# Patient Record
Sex: Male | Born: 1955 | Race: White | Hispanic: No | Marital: Married | Smoking: Never smoker
Health system: Southern US, Community
[De-identification: ages and names within clinical notes are randomized; demographics above are authoritative.]

## PROBLEM LIST (undated history)

## (undated) DIAGNOSIS — N529 Male erectile dysfunction, unspecified: Secondary | ICD-10-CM

## (undated) DIAGNOSIS — N200 Calculus of kidney: Secondary | ICD-10-CM

## (undated) DIAGNOSIS — I4891 Unspecified atrial fibrillation: Secondary | ICD-10-CM

## (undated) DIAGNOSIS — Z85038 Personal history of other malignant neoplasm of large intestine: Secondary | ICD-10-CM

## (undated) DIAGNOSIS — C187 Malignant neoplasm of sigmoid colon: Secondary | ICD-10-CM

## (undated) DIAGNOSIS — R7303 Prediabetes: Secondary | ICD-10-CM

## (undated) DIAGNOSIS — T451X5A Adverse effect of antineoplastic and immunosuppressive drugs, initial encounter: Secondary | ICD-10-CM

## (undated) DIAGNOSIS — K219 Gastro-esophageal reflux disease without esophagitis: Secondary | ICD-10-CM

## (undated) DIAGNOSIS — G620 Drug-induced polyneuropathy: Secondary | ICD-10-CM

## (undated) DIAGNOSIS — I10 Essential (primary) hypertension: Secondary | ICD-10-CM

## (undated) DIAGNOSIS — G4730 Sleep apnea, unspecified: Secondary | ICD-10-CM

## (undated) HISTORY — PX: FRACTURE SURGERY: SHX138

## (undated) HISTORY — PX: LITHOTRIPSY: SUR834

## (undated) HISTORY — DX: Calculus of kidney: N20.0

## (undated) HISTORY — DX: Gastro-esophageal reflux disease without esophagitis: K21.9

## (undated) HISTORY — DX: Sleep apnea, unspecified: G47.30

## (undated) HISTORY — DX: Unspecified atrial fibrillation: I48.91

## (undated) HISTORY — DX: Malignant neoplasm of sigmoid colon: C18.7

---

## 1999-03-01 HISTORY — PX: WRIST SURGERY: SHX841

## 2005-09-24 ENCOUNTER — Ambulatory Visit: Payer: Self-pay

## 2005-10-08 ENCOUNTER — Ambulatory Visit: Payer: Self-pay

## 2008-04-11 ENCOUNTER — Emergency Department: Payer: Self-pay | Admitting: Emergency Medicine

## 2008-04-16 ENCOUNTER — Ambulatory Visit: Payer: Self-pay | Admitting: Specialist

## 2008-04-17 ENCOUNTER — Ambulatory Visit: Payer: Self-pay | Admitting: Specialist

## 2013-02-28 DIAGNOSIS — C187 Malignant neoplasm of sigmoid colon: Secondary | ICD-10-CM

## 2013-02-28 HISTORY — DX: Malignant neoplasm of sigmoid colon: C18.7

## 2013-06-10 ENCOUNTER — Ambulatory Visit: Payer: Self-pay | Admitting: Unknown Physician Specialty

## 2013-06-10 HISTORY — PX: OTHER SURGICAL HISTORY: SHX169

## 2013-06-10 LAB — HEPATIC FUNCTION PANEL A (ARMC)
ALK PHOS: 70 U/L
ALT: 21 U/L (ref 12–78)
Albumin: 4 g/dL (ref 3.4–5.0)
Bilirubin, Direct: 0.1 mg/dL (ref 0.00–0.20)
Bilirubin,Total: 0.4 mg/dL (ref 0.2–1.0)
SGOT(AST): 21 U/L (ref 15–37)
TOTAL PROTEIN: 7.7 g/dL (ref 6.4–8.2)

## 2013-06-10 LAB — CBC WITH DIFFERENTIAL/PLATELET
Basophil #: 0 10*3/uL (ref 0.0–0.1)
Basophil %: 0.6 %
Eosinophil #: 0 10*3/uL (ref 0.0–0.7)
Eosinophil %: 0.8 %
HCT: 46.3 % (ref 40.0–52.0)
HGB: 15 g/dL (ref 13.0–18.0)
Lymphocyte #: 1.1 10*3/uL (ref 1.0–3.6)
Lymphocyte %: 23.6 %
MCH: 27.8 pg (ref 26.0–34.0)
MCHC: 32.5 g/dL (ref 32.0–36.0)
MCV: 86 fL (ref 80–100)
MONO ABS: 0.5 x10 3/mm (ref 0.2–1.0)
MONOS PCT: 11.5 %
NEUTROS ABS: 2.9 10*3/uL (ref 1.4–6.5)
Neutrophil %: 63.5 %
Platelet: 134 10*3/uL — ABNORMAL LOW (ref 150–440)
RBC: 5.4 10*6/uL (ref 4.40–5.90)
RDW: 13.7 % (ref 11.5–14.5)
WBC: 4.6 10*3/uL (ref 3.8–10.6)

## 2013-06-10 LAB — PROTIME-INR
INR: 0.9
Prothrombin Time: 12.3 secs (ref 11.5–14.7)

## 2013-06-10 LAB — BASIC METABOLIC PANEL
Anion Gap: 5 — ABNORMAL LOW (ref 7–16)
BUN: 17 mg/dL (ref 7–18)
CHLORIDE: 102 mmol/L (ref 98–107)
CO2: 30 mmol/L (ref 21–32)
CREATININE: 0.86 mg/dL (ref 0.60–1.30)
Calcium, Total: 8.6 mg/dL (ref 8.5–10.1)
EGFR (African American): >60
GLUCOSE: 102 mg/dL — AB (ref 65–99)
OSMOLALITY: 276 (ref 275–301)
Potassium: 4 mmol/L (ref 3.5–5.1)
Sodium: 137 mmol/L (ref 136–145)

## 2013-06-11 ENCOUNTER — Encounter: Payer: Self-pay | Admitting: General Surgery

## 2013-06-11 ENCOUNTER — Ambulatory Visit: Payer: Self-pay | Admitting: Unknown Physician Specialty

## 2013-06-11 ENCOUNTER — Ambulatory Visit (INDEPENDENT_AMBULATORY_CARE_PROVIDER_SITE_OTHER): Payer: BC Managed Care – PPO | Admitting: General Surgery

## 2013-06-11 VITALS — BP 134/72 | HR 72 | Resp 12 | Ht 66.0 in | Wt 231.0 lb

## 2013-06-11 DIAGNOSIS — C187 Malignant neoplasm of sigmoid colon: Secondary | ICD-10-CM

## 2013-06-11 DIAGNOSIS — Z86010 Personal history of colonic polyps: Secondary | ICD-10-CM

## 2013-06-11 DIAGNOSIS — K6389 Other specified diseases of intestine: Secondary | ICD-10-CM

## 2013-06-11 LAB — CEA: CEA: 3 ng/mL (ref 0.0–4.7)

## 2013-06-11 LAB — PATHOLOGY REPORT

## 2013-06-11 MED ORDER — NEOMYCIN SULFATE 500 MG PO TABS: 500.0000 mg | ORAL_TABLET | Freq: Once | ORAL | Status: DC

## 2013-06-11 MED ORDER — METRONIDAZOLE 500 MG PO TABS: 500.0000 mg | ORAL_TABLET | Freq: Once | ORAL | Status: DC

## 2013-06-11 MED ORDER — POLYETHYLENE GLYCOL 3350 17 GM/SCOOP PO POWD: 1.0000 | Freq: Once | ORAL | Status: DC
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
## 2013-06-11 NOTE — Patient Instructions (Addendum)
Patient to be scheduled for colon surgery. The patient is aware to call back for any questions or concerns.  Patient is scheduled for surgery at [facility] for 06/21/13. He will pre admit by phone for this. Patient is aware of bowel prep instructions. Miralax prescription and Flagyl and Neomycin prescriptions sent to his pharmacy.

## 2013-06-12 ENCOUNTER — Other Ambulatory Visit: Payer: Self-pay | Admitting: General Surgery

## 2013-06-12 DIAGNOSIS — C187 Malignant neoplasm of sigmoid colon: Secondary | ICD-10-CM | POA: Insufficient documentation

## 2013-06-12 DIAGNOSIS — Z860100 Personal history of colon polyps, unspecified: Secondary | ICD-10-CM | POA: Insufficient documentation

## 2013-06-13 ENCOUNTER — Telehealth: Payer: Self-pay

## 2013-06-13 NOTE — Telephone Encounter (Signed)
Patient has been scheduled for an EKG at [facility] for 06/14/13 at 8:30 am. He will arrive by 8:00 am and report to the Registration desk in the [place]. Patient is aware of date and time.

## 2013-06-14 ENCOUNTER — Ambulatory Visit: Payer: Self-pay | Admitting: General Surgery

## 2013-06-19 ENCOUNTER — Telehealth: Payer: Self-pay

## 2013-06-19 NOTE — Telephone Encounter (Signed)
Pre admit testing called and states that the patient has A-Fib on his recent EKG done 06/14/13. He will require cardiac clearance for his surgery scheduled 06/21/13. The patient is scheduled to see Dr Miquel Dunn today 06/19/13 at 3 pm for this. Patient is aware of date and time.

## 2013-06-20 HISTORY — PX: SIGMOIDOSCOPY: SUR1295

## 2013-06-20 HISTORY — PX: COLON SURGERY: SHX602

## 2013-06-20 NOTE — Patient Instructions (Signed)
Cardiology clearance received from Dr Sharlet Salina  office at [facility]. Cardiac clearance faxed to [facility] Pre admitting testing.

## 2013-06-21 ENCOUNTER — Inpatient Hospital Stay: Payer: Self-pay | Admitting: General Surgery

## 2013-06-21 DIAGNOSIS — C187 Malignant neoplasm of sigmoid colon: Secondary | ICD-10-CM

## 2013-06-22 LAB — CREATININE, SERUM
Creatinine: 0.93 mg/dL (ref 0.60–1.30)
EGFR (African American): >60
EGFR (Non-African Amer.): >60

## 2013-06-23 LAB — BASIC METABOLIC PANEL
ANION GAP: 6 — AB (ref 7–16)
BUN: 13 mg/dL (ref 7–18)
CALCIUM: 8.3 mg/dL — AB (ref 8.5–10.1)
CO2: 29 mmol/L (ref 21–32)
CREATININE: 0.89 mg/dL (ref 0.60–1.30)
Chloride: 105 mmol/L (ref 98–107)
EGFR (African American): >60
EGFR (Non-African Amer.): >60
Glucose: 116 mg/dL — ABNORMAL HIGH (ref 65–99)
Osmolality: 280 (ref 275–301)
Potassium: 3.4 mmol/L — ABNORMAL LOW (ref 3.5–5.1)
SODIUM: 140 mmol/L (ref 136–145)

## 2013-06-23 LAB — CBC WITH DIFFERENTIAL/PLATELET
BASOS ABS: 0.1 10*3/uL (ref 0.0–0.1)
Basophil %: 0.7 %
EOS ABS: 0.1 10*3/uL (ref 0.0–0.7)
Eosinophil %: 0.9 %
HCT: 40 % (ref 40.0–52.0)
HGB: 13.4 g/dL (ref 13.0–18.0)
LYMPHS ABS: 1.6 10*3/uL (ref 1.0–3.6)
Lymphocyte %: 19.8 %
MCH: 29 pg (ref 26.0–34.0)
MCHC: 33.4 g/dL (ref 32.0–36.0)
MCV: 87 fL (ref 80–100)
MONOS PCT: 7.6 %
Monocyte #: 0.6 x10 3/mm (ref 0.2–1.0)
NEUTROS PCT: 71 %
Neutrophil #: 5.9 10*3/uL (ref 1.4–6.5)
Platelet: 232 10*3/uL (ref 150–440)
RBC: 4.61 10*6/uL (ref 4.40–5.90)
RDW: 14.3 % (ref 11.5–14.5)
WBC: 8.2 10*3/uL (ref 3.8–10.6)

## 2013-06-24 ENCOUNTER — Encounter: Payer: Self-pay | Admitting: General Surgery

## 2013-06-24 ENCOUNTER — Telehealth: Payer: Self-pay | Admitting: General Surgery

## 2013-06-24 LAB — PATHOLOGY REPORT

## 2013-06-24 NOTE — Telephone Encounter (Signed)
Notified of pathology results. 1 of 13 nodes positive.  Will benefit from Medical oncology input. Will move f/u appt from 5/13 to 5/7.

## 2013-06-25 ENCOUNTER — Telehealth: Payer: Self-pay | Admitting: *Deleted

## 2013-06-25 ENCOUNTER — Encounter: Payer: Self-pay | Admitting: General Surgery

## 2013-06-25 NOTE — Telephone Encounter (Signed)
Message copied by Chrystie Nose on Tue Jun 25, 2013  8:48 AM ------      Message from: Caney, Forest Gleason      Created: Tue Jun 25, 2013  8:09 AM       Contact patient and arrange for him to come in on Friday morning for staple removal. Apply steristrips. Thanks.  ------

## 2013-06-25 NOTE — Telephone Encounter (Signed)
Notified patient as instructed, patient pleased. Discussed follow-up appointments, patient agrees appt 06/28/13 @ 9.00am

## 2013-06-28 ENCOUNTER — Ambulatory Visit (INDEPENDENT_AMBULATORY_CARE_PROVIDER_SITE_OTHER): Payer: BC Managed Care – PPO | Admitting: *Deleted

## 2013-06-28 DIAGNOSIS — C187 Malignant neoplasm of sigmoid colon: Secondary | ICD-10-CM

## 2013-06-28 NOTE — Patient Instructions (Signed)
The staples were removed and steri strips applied. 

## 2013-07-03 ENCOUNTER — Encounter: Payer: Self-pay | Admitting: General Surgery

## 2013-07-04 ENCOUNTER — Encounter: Payer: Self-pay | Admitting: General Surgery

## 2013-07-04 ENCOUNTER — Ambulatory Visit (INDEPENDENT_AMBULATORY_CARE_PROVIDER_SITE_OTHER): Payer: BC Managed Care – PPO | Admitting: General Surgery

## 2013-07-04 VITALS — BP 130/82 | HR 74 | Resp 14 | Ht 66.0 in | Wt 228.0 lb

## 2013-07-04 DIAGNOSIS — C187 Malignant neoplasm of sigmoid colon: Secondary | ICD-10-CM

## 2013-07-04 NOTE — Progress Notes (Signed)
Patient ID: Paul Irwin, male   DOB: 1955/07/18, 58 y.o.   MRN: [id number]  Chief Complaint  Patient presents with  . Follow-up    Post Op Sigmoid Colectomy    HPI Paul Irwin is a 58 y.o. male here today for his post op sigmoid colectomy done on 06/20/13. Patient states he is doing well.  HPI  Past Medical History  Diagnosis Date  . Sleep apnea   . Malignant neoplasm of sigmoid colon     T3,N1a. Moderately well differentiated.  1 13 nodes positive.     Past Surgical History  Procedure Laterality Date  . Colonoscopy  06/10/13    Dr. Vira Agar  . Wrist surgery Right 2001  . Sigmoidoscopy  06/20/13    Family History  Problem Relation Age of Onset  . Heart disease Mother   . Heart disease Father     Social History History  Substance Use Topics  . Smoking status: Never Smoker   . Smokeless tobacco: Never Used  . Alcohol Use: No    No Known Allergies  Current Outpatient Prescriptions  Medication Sig Dispense Refill  . HYDROcodone-acetaminophen (NORCO/VICODIN) 5-325 MG per tablet Take 1 tablet by mouth as needed.      . metoprolol succinate (TOPROL-XL) 25 MG 24 hr tablet Take 1 tablet by mouth daily.      . metroNIDAZOLE (FLAGYL) 500 MG tablet Take 1 tablet (500 mg total) by mouth once. Take 1 tablet at 6 PM, then take remaninig 1 tablet at 11 PM.  2 tablet  0  . neomycin (MYCIFRADIN) 500 MG tablet Take 1 tablet (500 mg total) by mouth once. Take 2 tablets at 6 pm, then take remaninig 2 tablets at 11 pm.  4 tablet  0  . polyethylene glycol powder (GLYCOLAX/MIRALAX) powder Take 255 g (1 Container total) by mouth once.  255 g  0   No current facility-administered medications for this visit.    Review of Systems Review of Systems  Constitutional: Negative.   Respiratory: Negative.   Cardiovascular: Negative.     Blood pressure 130/82, pulse 74, resp. rate 14, height 5\' 6"  (1.676 m), weight 228 lb (103.42 kg).  Physical Exam Physical Exam  Constitutional: He is  oriented to person, place, and time. He appears well-developed and well-nourished.  Eyes: Conjunctivae are normal.  Neck: Thyromegaly present.  Cardiovascular: Normal rate, regular rhythm and normal heart sounds.   Pulmonary/Chest: Effort normal and breath sounds normal.  Abdominal: Soft. Normal appearance and bowel sounds are normal. There is no tenderness.  Incision looks clean and healing well. 5 by 15 granulation tissue at the lower area.   Neurological: He is alert and oriented to person, place, and time.  Skin: Skin is warm and dry.    Data Reviewed Pathology reviewed.  Assessment    Doing well status post sigmoid resection. Candidate for adjuvant chemotherapy     Plan    Role of adjuvant chemotherapy was discussed with the patient and his wife. Arrangements are in place for evaluation by medical oncology early next week.  A port will likely be required, but discussion of this will be deferred until after medical oncology assessment.    PCP: Paul Irwin  Patient is scheduled to see Oncology - Dr Paul Irwin at [facility] on 07/10/13 at 9:15 am. Patient is aware of date and time.   Paul Irwin 07/07/2013, 9:24 AM

## 2013-07-04 NOTE — Patient Instructions (Addendum)
Proper lifting techniques reviewed.Patient see the Medical Oncology . Patient to return in one month.    Patient is scheduled to see Oncology - Dr Grayland Ormond at [facility] on 07/10/13 at 9:15 am. Patient is aware of date and time.

## 2013-07-05 LAB — CEA: CEA: 0.5 ng/mL (ref 0.0–4.7)

## 2013-07-07 ENCOUNTER — Encounter: Payer: Self-pay | Admitting: General Surgery

## 2013-07-10 ENCOUNTER — Telehealth: Payer: Self-pay | Admitting: *Deleted

## 2013-07-10 ENCOUNTER — Ambulatory Visit: Payer: Self-pay | Admitting: Oncology

## 2013-07-10 ENCOUNTER — Ambulatory Visit: Payer: Self-pay | Admitting: General Surgery

## 2013-07-10 NOTE — Progress Notes (Signed)
Patient ID: Paul Irwin, male   DOB: 1955/11/07, 58 y.o.   MRN: [id number] Spoke with patient about having his Port Placement surgery done. Patient is scheduled for surgery at [facility] on 07/12/13. He will pre admit by phone on 07/11/13. Patient is aware of date and all instructions.

## 2013-07-15 ENCOUNTER — Ambulatory Visit: Payer: Self-pay | Admitting: Vascular Surgery

## 2013-07-24 LAB — COMPREHENSIVE METABOLIC PANEL
AST: 13 U/L — AB (ref 15–37)
Albumin: 3.6 g/dL (ref 3.4–5.0)
Alkaline Phosphatase: 59 U/L
Anion Gap: 8 (ref 7–16)
BILIRUBIN TOTAL: 0.4 mg/dL (ref 0.2–1.0)
BUN: 22 mg/dL — ABNORMAL HIGH (ref 7–18)
CO2: 29 mmol/L (ref 21–32)
CREATININE: 0.91 mg/dL (ref 0.60–1.30)
Calcium, Total: 8.7 mg/dL (ref 8.5–10.1)
Chloride: 103 mmol/L (ref 98–107)
EGFR (African American): >60
EGFR (Non-African Amer.): >60
Glucose: 139 mg/dL — ABNORMAL HIGH (ref 65–99)
Osmolality: 285 (ref 275–301)
Potassium: 3.7 mmol/L (ref 3.5–5.1)
SGPT (ALT): 23 U/L (ref 12–78)
Sodium: 140 mmol/L (ref 136–145)
Total Protein: 6.7 g/dL (ref 6.4–8.2)

## 2013-07-24 LAB — CBC CANCER CENTER
BASOS PCT: 1 %
Basophil #: 0.1 x10 3/mm (ref 0.0–0.1)
Eosinophil #: 0.1 x10 3/mm (ref 0.0–0.7)
Eosinophil %: 1.5 %
HCT: 40.6 % (ref 40.0–52.0)
HGB: 13.6 g/dL (ref 13.0–18.0)
Lymphocyte #: 1.5 x10 3/mm (ref 1.0–3.6)
Lymphocyte %: 25.8 %
MCH: 28.6 pg (ref 26.0–34.0)
MCHC: 33.5 g/dL (ref 32.0–36.0)
MCV: 85 fL (ref 80–100)
Monocyte #: 0.4 x10 3/mm (ref 0.2–1.0)
Monocyte %: 6.6 %
NEUTROS ABS: 3.8 x10 3/mm (ref 1.4–6.5)
NEUTROS PCT: 65.1 %
PLATELETS: 154 x10 3/mm (ref 150–440)
RBC: 4.76 10*6/uL (ref 4.40–5.90)
RDW: 14.4 % (ref 11.5–14.5)
WBC: 5.8 x10 3/mm (ref 3.8–10.6)

## 2013-07-25 LAB — CEA: CEA: 0.5 ng/mL (ref 0.0–4.7)

## 2013-07-29 ENCOUNTER — Ambulatory Visit: Payer: Self-pay | Admitting: Oncology

## 2013-07-31 LAB — CBC CANCER CENTER
BASOS ABS: 0 x10 3/mm (ref 0.0–0.1)
BASOS PCT: 0.3 %
Eosinophil #: 0.3 x10 3/mm (ref 0.0–0.7)
Eosinophil %: 4.2 %
HCT: 41.7 % (ref 40.0–52.0)
HGB: 13.7 g/dL (ref 13.0–18.0)
Lymphocyte #: 2 x10 3/mm (ref 1.0–3.6)
Lymphocyte %: 29.6 %
MCH: 28.2 pg (ref 26.0–34.0)
MCHC: 32.8 g/dL (ref 32.0–36.0)
MCV: 86 fL (ref 80–100)
MONO ABS: 0.4 x10 3/mm (ref 0.2–1.0)
Monocyte %: 5.7 %
Neutrophil #: 4 x10 3/mm (ref 1.4–6.5)
Neutrophil %: 60.2 %
Platelet: 150 x10 3/mm (ref 150–440)
RBC: 4.86 10*6/uL (ref 4.40–5.90)
RDW: 14.3 % (ref 11.5–14.5)
WBC: 6.6 x10 3/mm (ref 3.8–10.6)

## 2013-07-31 LAB — BASIC METABOLIC PANEL
Anion Gap: 7 (ref 7–16)
BUN: 25 mg/dL — ABNORMAL HIGH (ref 7–18)
CALCIUM: 8.8 mg/dL (ref 8.5–10.1)
Chloride: 103 mmol/L (ref 98–107)
Co2: 30 mmol/L (ref 21–32)
Creatinine: 0.91 mg/dL (ref 0.60–1.30)
EGFR (African American): >60
Glucose: 97 mg/dL (ref 65–99)
Osmolality: 284 (ref 275–301)
Potassium: 4.2 mmol/L (ref 3.5–5.1)
Sodium: 140 mmol/L (ref 136–145)

## 2013-08-05 ENCOUNTER — Encounter: Payer: Self-pay | Admitting: General Surgery

## 2013-08-05 ENCOUNTER — Ambulatory Visit (INDEPENDENT_AMBULATORY_CARE_PROVIDER_SITE_OTHER): Payer: BC Managed Care – PPO | Admitting: General Surgery

## 2013-08-05 VITALS — BP 130/74 | HR 72 | Resp 14 | Ht 66.0 in | Wt 231.0 lb

## 2013-08-05 DIAGNOSIS — C187 Malignant neoplasm of sigmoid colon: Secondary | ICD-10-CM

## 2013-08-05 NOTE — Patient Instructions (Signed)
Patient to return in six months  

## 2013-08-05 NOTE — Progress Notes (Signed)
Patient ID: Paul Irwin, male   DOB: [birth year], 58 y.o.   MRN: [id number]  Chief Complaint  Patient presents with  . Follow-up    Colon cancer    HPI Paul Irwin is a 58 y.o. male here today for his follow up sigmoid colectomy done on 06/20/13. Patient states he is doing well. He started his chemo on 07/24/13. He tolerated this well. 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  HPI  Past Medical History  Diagnosis Date  . Sleep apnea   . Malignant neoplasm of sigmoid colon     T3,N1a. Moderately well differentiated.  1 13 nodes positive.     Past Surgical History  Procedure Laterality Date  . Colonoscopy  06/10/13    Dr. Vira Agar  . Wrist surgery Right 2001  . Sigmoidoscopy  06/20/13    Family History  Problem Relation Age of Onset  . Heart disease Mother   . Heart disease Father     Social History History  Substance Use Topics  . Smoking status: Never Smoker   . Smokeless tobacco: Never Used  . Alcohol Use: No    No Known Allergies  Current Outpatient Prescriptions  Medication Sig Dispense Refill  . HYDROcodone-acetaminophen (NORCO/VICODIN) 5-325 MG per tablet Take 1 tablet by mouth as needed.      . metoprolol succinate (TOPROL-XL) 25 MG 24 hr tablet Take 1 tablet by mouth daily.      . metroNIDAZOLE (FLAGYL) 500 MG tablet Take 1 tablet (500 mg total) by mouth once. Take 1 tablet at 6 PM, then take remaninig 1 tablet at 11 PM.  2 tablet  0  . neomycin (MYCIFRADIN) 500 MG tablet Take 1 tablet (500 mg total) by mouth once. Take 2 tablets at 6 pm, then take remaninig 2 tablets at 11 pm.  4 tablet  0  . polyethylene glycol powder (GLYCOLAX/MIRALAX) powder Take 255 g (1 Container total) by mouth once.  255 g  0  . prochlorperazine (COMPAZINE) 10 MG tablet Take 1 tablet by mouth daily.       No current facility-administered medications for this visit.    Review of Systems Review of Systems   Constitutional: Negative.   Respiratory: Negative.   Cardiovascular: Negative.   Gastrointestinal: Negative for nausea, vomiting, abdominal pain, diarrhea, constipation, abdominal distention and rectal pain.    Blood pressure 130/74, pulse 72, resp. rate 14, height 5\' 6"  (1.676 m), weight 231 lb (104.781 kg).  Physical Exam Physical Exam  Constitutional: He is oriented to person, place, and time. He appears well-developed and well-nourished.  Eyes: Conjunctivae are normal. No scleral icterus.  Neck: Neck supple.  Cardiovascular: Normal rate and normal heart sounds.   Pulmonary/Chest: Effort normal and breath sounds normal.  Abdominal: Soft. Normal appearance and bowel sounds are normal. There is no tenderness.  Neurological: He is alert and oriented to person, place, and time.  Skin: Skin is warm and dry.  No evidence of ventral hernia with the legs were elevated off the exam table.     Assessment    Doing well status post sigmoid colectomy.     Plan    We'll plan for a followup examination 6 months. Good judgment with strenuous activity was encouraged. He will likely be a candidate for a followup colonoscopy in spring 2016 with the GI service.     PCP: Mellody Drown  W Byrnett 08/05/2013, 9:14 PM

## 2013-08-07 LAB — COMPREHENSIVE METABOLIC PANEL
ALBUMIN: 3.5 g/dL (ref 3.4–5.0)
ALK PHOS: 65 U/L
Anion Gap: 9 (ref 7–16)
BUN: 21 mg/dL — ABNORMAL HIGH (ref 7–18)
Bilirubin,Total: 0.5 mg/dL (ref 0.2–1.0)
CALCIUM: 8.6 mg/dL (ref 8.5–10.1)
CHLORIDE: 103 mmol/L (ref 98–107)
CREATININE: 0.89 mg/dL (ref 0.60–1.30)
Co2: 27 mmol/L (ref 21–32)
EGFR (African American): >60
EGFR (Non-African Amer.): >60
Glucose: 110 mg/dL — ABNORMAL HIGH (ref 65–99)
Osmolality: 281 (ref 275–301)
POTASSIUM: 4.3 mmol/L (ref 3.5–5.1)
SGOT(AST): 12 U/L — ABNORMAL LOW (ref 15–37)
SGPT (ALT): 24 U/L (ref 12–78)
Sodium: 139 mmol/L (ref 136–145)
Total Protein: 6.5 g/dL (ref 6.4–8.2)

## 2013-08-07 LAB — CBC CANCER CENTER
BASOS PCT: 0.7 %
Basophil #: 0 x10 3/mm (ref 0.0–0.1)
EOS ABS: 0.1 x10 3/mm (ref 0.0–0.7)
EOS PCT: 2.8 %
HCT: 39.7 % — ABNORMAL LOW (ref 40.0–52.0)
HGB: 13.3 g/dL (ref 13.0–18.0)
Lymphocyte #: 1.3 x10 3/mm (ref 1.0–3.6)
Lymphocyte %: 30.1 %
MCH: 28.6 pg (ref 26.0–34.0)
MCHC: 33.4 g/dL (ref 32.0–36.0)
MCV: 86 fL (ref 80–100)
MONO ABS: 0.4 x10 3/mm (ref 0.2–1.0)
Monocyte %: 9.4 %
NEUTROS ABS: 2.4 x10 3/mm (ref 1.4–6.5)
Neutrophil %: 57 %
Platelet: 154 x10 3/mm (ref 150–440)
RBC: 4.63 10*6/uL (ref 4.40–5.90)
RDW: 14.5 % (ref 11.5–14.5)
WBC: 4.2 x10 3/mm (ref 3.8–10.6)

## 2013-08-21 LAB — COMPREHENSIVE METABOLIC PANEL
ALK PHOS: 75 U/L
ANION GAP: 4 — AB (ref 7–16)
AST: 12 U/L — AB (ref 15–37)
Albumin: 3.3 g/dL — ABNORMAL LOW (ref 3.4–5.0)
BUN: 22 mg/dL — ABNORMAL HIGH (ref 7–18)
Bilirubin,Total: 0.4 mg/dL (ref 0.2–1.0)
CALCIUM: 8.4 mg/dL — AB (ref 8.5–10.1)
CO2: 29 mmol/L (ref 21–32)
Chloride: 103 mmol/L (ref 98–107)
Creatinine: 1.14 mg/dL (ref 0.60–1.30)
EGFR (Non-African Amer.): >60
Glucose: 117 mg/dL — ABNORMAL HIGH (ref 65–99)
OSMOLALITY: 276 (ref 275–301)
POTASSIUM: 4.1 mmol/L (ref 3.5–5.1)
SGPT (ALT): 27 U/L (ref 12–78)
Sodium: 136 mmol/L (ref 136–145)
Total Protein: 6.3 g/dL — ABNORMAL LOW (ref 6.4–8.2)

## 2013-08-21 LAB — CBC CANCER CENTER
Basophil #: 0 x10 3/mm (ref 0.0–0.1)
Basophil %: 1 %
EOS ABS: 0 x10 3/mm (ref 0.0–0.7)
Eosinophil %: 1.2 %
HCT: 38 % — AB (ref 40.0–52.0)
HGB: 12.8 g/dL — AB (ref 13.0–18.0)
LYMPHS PCT: 36.8 %
Lymphocyte #: 1.1 x10 3/mm (ref 1.0–3.6)
MCH: 29 pg (ref 26.0–34.0)
MCHC: 33.8 g/dL (ref 32.0–36.0)
MCV: 86 fL (ref 80–100)
Monocyte #: 0.5 x10 3/mm (ref 0.2–1.0)
Monocyte %: 15.5 %
NEUTROS ABS: 1.3 x10 3/mm — AB (ref 1.4–6.5)
Neutrophil %: 45.5 %
Platelet: 99 x10 3/mm — ABNORMAL LOW (ref 150–440)
RBC: 4.43 10*6/uL (ref 4.40–5.90)
RDW: 14.9 % — AB (ref 11.5–14.5)
WBC: 2.9 x10 3/mm — AB (ref 3.8–10.6)

## 2013-08-28 ENCOUNTER — Ambulatory Visit: Payer: Self-pay | Admitting: Oncology

## 2013-09-04 LAB — COMPREHENSIVE METABOLIC PANEL
ALT: 25 U/L (ref 12–78)
ANION GAP: 10 (ref 7–16)
AST: 15 U/L (ref 15–37)
Albumin: 3.4 g/dL (ref 3.4–5.0)
Alkaline Phosphatase: 76 U/L
BUN: 20 mg/dL — ABNORMAL HIGH (ref 7–18)
Bilirubin,Total: 0.5 mg/dL (ref 0.2–1.0)
CALCIUM: 8.7 mg/dL (ref 8.5–10.1)
CO2: 27 mmol/L (ref 21–32)
Chloride: 105 mmol/L (ref 98–107)
Creatinine: 1.12 mg/dL (ref 0.60–1.30)
EGFR (African American): >60
EGFR (Non-African Amer.): >60
Glucose: 123 mg/dL — ABNORMAL HIGH (ref 65–99)
OSMOLALITY: 287 (ref 275–301)
Potassium: 3.8 mmol/L (ref 3.5–5.1)
SODIUM: 142 mmol/L (ref 136–145)
Total Protein: 6.5 g/dL (ref 6.4–8.2)

## 2013-09-04 LAB — CBC CANCER CENTER
Basophil #: 0 x10 3/mm (ref 0.0–0.1)
Basophil %: 0.5 %
Eosinophil #: 0.1 x10 3/mm (ref 0.0–0.7)
Eosinophil %: 3 %
HCT: 37.2 % — ABNORMAL LOW (ref 40.0–52.0)
HGB: 12.7 g/dL — ABNORMAL LOW (ref 13.0–18.0)
LYMPHS ABS: 1 x10 3/mm (ref 1.0–3.6)
Lymphocyte %: 46 %
MCH: 29 pg (ref 26.0–34.0)
MCHC: 34.1 g/dL (ref 32.0–36.0)
MCV: 85 fL (ref 80–100)
Monocyte #: 0.3 x10 3/mm (ref 0.2–1.0)
Monocyte %: 14.3 %
Neutrophil #: 0.8 x10 3/mm — ABNORMAL LOW (ref 1.4–6.5)
Neutrophil %: 36.2 %
PLATELETS: 113 x10 3/mm — AB (ref 150–440)
RBC: 4.38 10*6/uL — AB (ref 4.40–5.90)
RDW: 16 % — ABNORMAL HIGH (ref 11.5–14.5)
WBC: 2.1 x10 3/mm — ABNORMAL LOW (ref 3.8–10.6)

## 2013-09-18 LAB — CBC CANCER CENTER
BASOS ABS: 0.1 x10 3/mm (ref 0.0–0.1)
Basophil %: 0.7 %
Eosinophil #: 0.1 x10 3/mm (ref 0.0–0.7)
Eosinophil %: 0.7 %
HCT: 40.2 % (ref 40.0–52.0)
HGB: 13.4 g/dL (ref 13.0–18.0)
Lymphocyte #: 1.6 x10 3/mm (ref 1.0–3.6)
Lymphocyte %: 19.7 %
MCH: 29.4 pg (ref 26.0–34.0)
MCHC: 33.3 g/dL (ref 32.0–36.0)
MCV: 88 fL (ref 80–100)
Monocyte #: 0.8 x10 3/mm (ref 0.2–1.0)
Monocyte %: 9.9 %
NEUTROS PCT: 69 %
Neutrophil #: 5.7 x10 3/mm (ref 1.4–6.5)
PLATELETS: 144 x10 3/mm — AB (ref 150–440)
RBC: 4.56 10*6/uL (ref 4.40–5.90)
RDW: 18.1 % — ABNORMAL HIGH (ref 11.5–14.5)
WBC: 8.3 x10 3/mm (ref 3.8–10.6)

## 2013-09-18 LAB — COMPREHENSIVE METABOLIC PANEL
ALK PHOS: 74 U/L
ALT: 23 U/L
ANION GAP: 9 (ref 7–16)
AST: 13 U/L — AB (ref 15–37)
Albumin: 3.4 g/dL (ref 3.4–5.0)
BILIRUBIN TOTAL: 0.3 mg/dL (ref 0.2–1.0)
BUN: 20 mg/dL — AB (ref 7–18)
CREATININE: 1.02 mg/dL (ref 0.60–1.30)
Calcium, Total: 9.5 mg/dL (ref 8.5–10.1)
Chloride: 106 mmol/L (ref 98–107)
Co2: 27 mmol/L (ref 21–32)
EGFR (African American): >60
GLUCOSE: 104 mg/dL — AB (ref 65–99)
OSMOLALITY: 286 (ref 275–301)
Potassium: 3.9 mmol/L (ref 3.5–5.1)
Sodium: 142 mmol/L (ref 136–145)
TOTAL PROTEIN: 6.8 g/dL (ref 6.4–8.2)

## 2013-09-24 DIAGNOSIS — Z85038 Personal history of other malignant neoplasm of large intestine: Secondary | ICD-10-CM

## 2013-09-28 ENCOUNTER — Ambulatory Visit: Payer: Self-pay | Admitting: Oncology

## 2013-10-01 LAB — CBC WITH DIFFERENTIAL/PLATELET
BASOS PCT: 0.4 %
Basophil #: 0 10*3/uL (ref 0.0–0.1)
Eosinophil #: 0.2 10*3/uL (ref 0.0–0.7)
Eosinophil %: 1.6 %
HCT: 37.4 % — ABNORMAL LOW (ref 40.0–52.0)
HGB: 12.6 g/dL — AB (ref 13.0–18.0)
LYMPHS ABS: 1.7 10*3/uL (ref 1.0–3.6)
Lymphocyte %: 17.1 %
MCH: 30 pg (ref 26.0–34.0)
MCHC: 33.6 g/dL (ref 32.0–36.0)
MCV: 89 fL (ref 80–100)
Monocyte #: 0.7 x10 3/mm (ref 0.2–1.0)
Monocyte %: 6.9 %
Neutrophil #: 7.4 10*3/uL — ABNORMAL HIGH (ref 1.4–6.5)
Neutrophil %: 74 %
Platelet: 111 10*3/uL — ABNORMAL LOW (ref 150–440)
RBC: 4.19 10*6/uL — AB (ref 4.40–5.90)
RDW: 18 % — AB (ref 11.5–14.5)
WBC: 10 10*3/uL (ref 3.8–10.6)

## 2013-10-01 LAB — COMPREHENSIVE METABOLIC PANEL
AST: 16 U/L (ref 15–37)
Albumin: 3.4 g/dL (ref 3.4–5.0)
Alkaline Phosphatase: 106 U/L
Anion Gap: 8 (ref 7–16)
BUN: 22 mg/dL — ABNORMAL HIGH (ref 7–18)
Bilirubin,Total: 0.2 mg/dL (ref 0.2–1.0)
CALCIUM: 8.6 mg/dL (ref 8.5–10.1)
CREATININE: 1.2 mg/dL (ref 0.60–1.30)
Chloride: 104 mmol/L (ref 98–107)
Co2: 28 mmol/L (ref 21–32)
Glucose: 120 mg/dL — ABNORMAL HIGH (ref 65–99)
Osmolality: 284 (ref 275–301)
Potassium: 3.8 mmol/L (ref 3.5–5.1)
SGPT (ALT): 26 U/L
Sodium: 140 mmol/L (ref 136–145)
Total Protein: 6.5 g/dL (ref 6.4–8.2)

## 2013-10-16 LAB — CBC CANCER CENTER
Basophil #: 0.1 x10 3/mm (ref 0.0–0.1)
Basophil %: 1 %
EOS ABS: 0.1 x10 3/mm (ref 0.0–0.7)
Eosinophil %: 0.6 %
HCT: 40 % (ref 40.0–52.0)
HGB: 13.3 g/dL (ref 13.0–18.0)
Lymphocyte #: 1.7 x10 3/mm (ref 1.0–3.6)
Lymphocyte %: 13.8 %
MCH: 30.4 pg (ref 26.0–34.0)
MCHC: 33.3 g/dL (ref 32.0–36.0)
MCV: 91 fL (ref 80–100)
MONOS PCT: 7.3 %
Monocyte #: 0.9 x10 3/mm (ref 0.2–1.0)
NEUTROS PCT: 77.3 %
Neutrophil #: 9.7 x10 3/mm — ABNORMAL HIGH (ref 1.4–6.5)
Platelet: 115 x10 3/mm — ABNORMAL LOW (ref 150–440)
RBC: 4.38 10*6/uL — AB (ref 4.40–5.90)
RDW: 18.7 % — ABNORMAL HIGH (ref 11.5–14.5)
WBC: 12.6 x10 3/mm — ABNORMAL HIGH (ref 3.8–10.6)

## 2013-10-16 LAB — COMPREHENSIVE METABOLIC PANEL
ANION GAP: 8 (ref 7–16)
AST: 21 U/L (ref 15–37)
Albumin: 3.5 g/dL (ref 3.4–5.0)
Alkaline Phosphatase: 127 U/L — ABNORMAL HIGH
BILIRUBIN TOTAL: 0.4 mg/dL (ref 0.2–1.0)
BUN: 19 mg/dL — ABNORMAL HIGH (ref 7–18)
Calcium, Total: 8.8 mg/dL (ref 8.5–10.1)
Chloride: 103 mmol/L (ref 98–107)
Co2: 28 mmol/L (ref 21–32)
Creatinine: 1.01 mg/dL (ref 0.60–1.30)
EGFR (African American): >60
EGFR (Non-African Amer.): >60
Glucose: 122 mg/dL — ABNORMAL HIGH (ref 65–99)
OSMOLALITY: 281 (ref 275–301)
Potassium: 4.1 mmol/L (ref 3.5–5.1)
SGPT (ALT): 30 U/L
Sodium: 139 mmol/L (ref 136–145)
TOTAL PROTEIN: 6.7 g/dL (ref 6.4–8.2)

## 2013-10-16 LAB — MAGNESIUM: Magnesium: 2.1 mg/dL

## 2013-10-23 NOTE — Telephone Encounter (Signed)
error 

## 2013-10-29 ENCOUNTER — Ambulatory Visit: Payer: Self-pay | Admitting: Oncology

## 2013-10-30 LAB — CBC CANCER CENTER
BASOS ABS: 0 x10 3/mm (ref 0.0–0.1)
BASOS PCT: 0.6 %
EOS PCT: 0.6 %
Eosinophil #: 0 x10 3/mm (ref 0.0–0.7)
HCT: 38.5 % — ABNORMAL LOW (ref 40.0–52.0)
HGB: 12.9 g/dL — AB (ref 13.0–18.0)
LYMPHS PCT: 17.5 %
Lymphocyte #: 1.2 x10 3/mm (ref 1.0–3.6)
MCH: 31.1 pg (ref 26.0–34.0)
MCHC: 33.6 g/dL (ref 32.0–36.0)
MCV: 93 fL (ref 80–100)
Monocyte #: 0.5 x10 3/mm (ref 0.2–1.0)
Monocyte %: 7.3 %
NEUTROS ABS: 5.2 x10 3/mm (ref 1.4–6.5)
NEUTROS PCT: 74 %
PLATELETS: 117 x10 3/mm — AB (ref 150–440)
RBC: 4.15 10*6/uL — AB (ref 4.40–5.90)
RDW: 18.4 % — ABNORMAL HIGH (ref 11.5–14.5)
WBC: 7 x10 3/mm (ref 3.8–10.6)

## 2013-10-30 LAB — COMPREHENSIVE METABOLIC PANEL WITH GFR
Albumin: 3.4 g/dL
Alkaline Phosphatase: 132 U/L — ABNORMAL HIGH
Anion Gap: 9
BUN: 22 mg/dL — ABNORMAL HIGH
Bilirubin,Total: 0.2 mg/dL
Calcium, Total: 8.9 mg/dL
Chloride: 105 mmol/L
Co2: 27 mmol/L
Creatinine: 0.97 mg/dL
EGFR (African American): >60
EGFR (Non-African Amer.): >60
Glucose: 125 mg/dL — ABNORMAL HIGH
Osmolality: 286
Potassium: 3.4 mmol/L — ABNORMAL LOW
SGOT(AST): 16 U/L
SGPT (ALT): 27 U/L
Sodium: 141 mmol/L
Total Protein: 6.4 g/dL

## 2013-11-13 LAB — MAGNESIUM: MAGNESIUM: 2 mg/dL

## 2013-11-13 LAB — CBC CANCER CENTER
BASOS ABS: 0 x10 3/mm (ref 0.0–0.1)
Basophil %: 0.5 %
Eosinophil #: 0.1 x10 3/mm (ref 0.0–0.7)
Eosinophil %: 0.9 %
HCT: 38 % — ABNORMAL LOW (ref 40.0–52.0)
HGB: 12.6 g/dL — AB (ref 13.0–18.0)
LYMPHS ABS: 1.1 x10 3/mm (ref 1.0–3.6)
Lymphocyte %: 15.3 %
MCH: 31.5 pg (ref 26.0–34.0)
MCHC: 33.1 g/dL (ref 32.0–36.0)
MCV: 95 fL (ref 80–100)
MONO ABS: 0.5 x10 3/mm (ref 0.2–1.0)
Monocyte %: 7.6 %
Neutrophil #: 5.4 x10 3/mm (ref 1.4–6.5)
Neutrophil %: 75.7 %
Platelet: 116 x10 3/mm — ABNORMAL LOW (ref 150–440)
RBC: 4 10*6/uL — AB (ref 4.40–5.90)
RDW: 17.8 % — ABNORMAL HIGH (ref 11.5–14.5)
WBC: 7.1 x10 3/mm (ref 3.8–10.6)

## 2013-11-13 LAB — COMPREHENSIVE METABOLIC PANEL
ALK PHOS: 128 U/L — AB
ALT: 28 U/L
ANION GAP: 11 (ref 7–16)
Albumin: 3.4 g/dL (ref 3.4–5.0)
BUN: 13 mg/dL (ref 7–18)
Bilirubin,Total: 0.4 mg/dL (ref 0.2–1.0)
CO2: 26 mmol/L (ref 21–32)
Calcium, Total: 8.9 mg/dL (ref 8.5–10.1)
Chloride: 103 mmol/L (ref 98–107)
Creatinine: 1.15 mg/dL (ref 0.60–1.30)
EGFR (Non-African Amer.): >60
GLUCOSE: 152 mg/dL — AB (ref 65–99)
OSMOLALITY: 282 (ref 275–301)
POTASSIUM: 3.4 mmol/L — AB (ref 3.5–5.1)
SGOT(AST): 20 U/L (ref 15–37)
SODIUM: 140 mmol/L (ref 136–145)
Total Protein: 6.4 g/dL (ref 6.4–8.2)

## 2013-11-27 LAB — COMPREHENSIVE METABOLIC PANEL
ANION GAP: 9 (ref 7–16)
Albumin: 3.3 g/dL — ABNORMAL LOW (ref 3.4–5.0)
Alkaline Phosphatase: 117 U/L — ABNORMAL HIGH
BILIRUBIN TOTAL: 0.3 mg/dL (ref 0.2–1.0)
BUN: 26 mg/dL — ABNORMAL HIGH (ref 7–18)
CHLORIDE: 104 mmol/L (ref 98–107)
CO2: 26 mmol/L (ref 21–32)
Calcium, Total: 8.9 mg/dL (ref 8.5–10.1)
Creatinine: 1.05 mg/dL (ref 0.60–1.30)
EGFR (Non-African Amer.): >60
Glucose: 132 mg/dL — ABNORMAL HIGH (ref 65–99)
Osmolality: 284 (ref 275–301)
Potassium: 3.6 mmol/L (ref 3.5–5.1)
SGOT(AST): 16 U/L (ref 15–37)
SGPT (ALT): 26 U/L
Sodium: 139 mmol/L (ref 136–145)
TOTAL PROTEIN: 6.2 g/dL — AB (ref 6.4–8.2)

## 2013-11-27 LAB — CBC CANCER CENTER
BASOS ABS: 0 x10 3/mm (ref 0.0–0.1)
Basophil %: 0.5 %
Eosinophil #: 0.1 x10 3/mm (ref 0.0–0.7)
Eosinophil %: 1.7 %
HCT: 36.1 % — AB (ref 40.0–52.0)
HGB: 12.2 g/dL — AB (ref 13.0–18.0)
LYMPHS PCT: 20.6 %
Lymphocyte #: 1.1 x10 3/mm (ref 1.0–3.6)
MCH: 32.3 pg (ref 26.0–34.0)
MCHC: 33.7 g/dL (ref 32.0–36.0)
MCV: 96 fL (ref 80–100)
MONOS PCT: 12.1 %
Monocyte #: 0.6 x10 3/mm (ref 0.2–1.0)
NEUTROS PCT: 65.1 %
Neutrophil #: 3.3 x10 3/mm (ref 1.4–6.5)
Platelet: 107 x10 3/mm — ABNORMAL LOW (ref 150–440)
RBC: 3.77 10*6/uL — ABNORMAL LOW (ref 4.40–5.90)
RDW: 17.6 % — AB (ref 11.5–14.5)
WBC: 5.1 x10 3/mm (ref 3.8–10.6)

## 2013-11-27 LAB — MAGNESIUM: MAGNESIUM: 2 mg/dL

## 2013-11-28 ENCOUNTER — Ambulatory Visit: Payer: Self-pay | Admitting: Oncology

## 2013-12-11 LAB — MAGNESIUM: Magnesium: 2 mg/dL

## 2013-12-11 LAB — CBC CANCER CENTER
Basophil #: 0 x10 3/mm (ref 0.0–0.1)
Basophil %: 0.5 %
EOS ABS: 0.1 x10 3/mm (ref 0.0–0.7)
EOS PCT: 1.4 %
HCT: 38.6 % — AB (ref 40.0–52.0)
HGB: 13 g/dL (ref 13.0–18.0)
Lymphocyte #: 1 x10 3/mm (ref 1.0–3.6)
Lymphocyte %: 13.6 %
MCH: 32.7 pg (ref 26.0–34.0)
MCHC: 33.7 g/dL (ref 32.0–36.0)
MCV: 97 fL (ref 80–100)
Monocyte #: 0.8 x10 3/mm (ref 0.2–1.0)
Monocyte %: 10.6 %
Neutrophil #: 5.7 x10 3/mm (ref 1.4–6.5)
Neutrophil %: 73.9 %
Platelet: 111 x10 3/mm — ABNORMAL LOW (ref 150–440)
RBC: 3.98 10*6/uL — ABNORMAL LOW (ref 4.40–5.90)
RDW: 17.9 % — AB (ref 11.5–14.5)
WBC: 7.7 x10 3/mm (ref 3.8–10.6)

## 2013-12-11 LAB — COMPREHENSIVE METABOLIC PANEL
ALK PHOS: 138 U/L — AB
Albumin: 3.5 g/dL (ref 3.4–5.0)
Anion Gap: 9 (ref 7–16)
BUN: 18 mg/dL (ref 7–18)
Bilirubin,Total: 0.3 mg/dL (ref 0.2–1.0)
CO2: 28 mmol/L (ref 21–32)
Calcium, Total: 9.1 mg/dL (ref 8.5–10.1)
Chloride: 103 mmol/L (ref 98–107)
Creatinine: 1.11 mg/dL (ref 0.60–1.30)
EGFR (Non-African Amer.): >60
Glucose: 138 mg/dL — ABNORMAL HIGH (ref 65–99)
Osmolality: 283 (ref 275–301)
Potassium: 3.8 mmol/L (ref 3.5–5.1)
SGOT(AST): 20 U/L (ref 15–37)
SGPT (ALT): 29 U/L
Sodium: 140 mmol/L (ref 136–145)
TOTAL PROTEIN: 6.5 g/dL (ref 6.4–8.2)

## 2013-12-25 LAB — CBC CANCER CENTER
Basophil #: 0.1 x10 3/mm (ref 0.0–0.1)
Basophil %: 1.2 %
Eosinophil #: 0.1 x10 3/mm (ref 0.0–0.7)
Eosinophil %: 1.3 %
HCT: 34 % — ABNORMAL LOW (ref 40.0–52.0)
HGB: 11.9 g/dL — ABNORMAL LOW (ref 13.0–18.0)
LYMPHS ABS: 1.1 x10 3/mm (ref 1.0–3.6)
LYMPHS PCT: 13.4 %
MCH: 33.8 pg (ref 26.0–34.0)
MCHC: 34.9 g/dL (ref 32.0–36.0)
MCV: 97 fL (ref 80–100)
MONO ABS: 0.7 x10 3/mm (ref 0.2–1.0)
MONOS PCT: 8.8 %
Neutrophil #: 6 x10 3/mm (ref 1.4–6.5)
Neutrophil %: 75.3 %
Platelet: 124 x10 3/mm — ABNORMAL LOW (ref 150–440)
RBC: 3.51 10*6/uL — ABNORMAL LOW (ref 4.40–5.90)
RDW: 17.1 % — ABNORMAL HIGH (ref 11.5–14.5)
WBC: 7.9 x10 3/mm (ref 3.8–10.6)

## 2013-12-25 LAB — COMPREHENSIVE METABOLIC PANEL
ANION GAP: 10 (ref 7–16)
Albumin: 3.1 g/dL — ABNORMAL LOW (ref 3.4–5.0)
Alkaline Phosphatase: 123 U/L — ABNORMAL HIGH
BUN: 14 mg/dL (ref 7–18)
Bilirubin,Total: 0.3 mg/dL (ref 0.2–1.0)
Calcium, Total: 8.3 mg/dL — ABNORMAL LOW (ref 8.5–10.1)
Chloride: 102 mmol/L (ref 98–107)
Co2: 26 mmol/L (ref 21–32)
Creatinine: 1.09 mg/dL (ref 0.60–1.30)
EGFR (African American): >60
EGFR (Non-African Amer.): >60
Glucose: 136 mg/dL — ABNORMAL HIGH (ref 65–99)
Osmolality: 278 (ref 275–301)
Potassium: 3.3 mmol/L — ABNORMAL LOW (ref 3.5–5.1)
SGOT(AST): 19 U/L (ref 15–37)
SGPT (ALT): 31 U/L
SODIUM: 138 mmol/L (ref 136–145)
TOTAL PROTEIN: 6 g/dL — AB (ref 6.4–8.2)

## 2013-12-25 LAB — MAGNESIUM: Magnesium: 1.8 mg/dL

## 2013-12-29 ENCOUNTER — Ambulatory Visit: Payer: Self-pay | Admitting: Oncology

## 2014-01-08 LAB — COMPREHENSIVE METABOLIC PANEL
ALBUMIN: 3.6 g/dL (ref 3.4–5.0)
ALT: 41 U/L
ANION GAP: 9 (ref 7–16)
AST: 34 U/L (ref 15–37)
Alkaline Phosphatase: 119 U/L — ABNORMAL HIGH
BILIRUBIN TOTAL: 0.5 mg/dL (ref 0.2–1.0)
BUN: 12 mg/dL (ref 7–18)
CO2: 27 mmol/L (ref 21–32)
Calcium, Total: 8.9 mg/dL (ref 8.5–10.1)
Chloride: 103 mmol/L (ref 98–107)
Creatinine: 1.18 mg/dL (ref 0.60–1.30)
EGFR (African American): >60
EGFR (Non-African Amer.): >60
GLUCOSE: 123 mg/dL — AB (ref 65–99)
OSMOLALITY: 279 (ref 275–301)
POTASSIUM: 3.5 mmol/L (ref 3.5–5.1)
Sodium: 139 mmol/L (ref 136–145)
Total Protein: 6.5 g/dL (ref 6.4–8.2)

## 2014-01-08 LAB — CBC CANCER CENTER
Basophil #: 0.1 x10 3/mm (ref 0.0–0.1)
Basophil %: 0.9 %
EOS ABS: 0 x10 3/mm (ref 0.0–0.7)
Eosinophil %: 0.6 %
HCT: 35.6 % — ABNORMAL LOW (ref 40.0–52.0)
HGB: 12 g/dL — ABNORMAL LOW (ref 13.0–18.0)
Lymphocyte #: 1.1 x10 3/mm (ref 1.0–3.6)
Lymphocyte %: 14.4 %
MCH: 32.8 pg (ref 26.0–34.0)
MCHC: 33.6 g/dL (ref 32.0–36.0)
MCV: 98 fL (ref 80–100)
MONOS PCT: 8 %
Monocyte #: 0.6 x10 3/mm (ref 0.2–1.0)
NEUTROS PCT: 76.1 %
Neutrophil #: 6.1 x10 3/mm (ref 1.4–6.5)
Platelet: 101 x10 3/mm — ABNORMAL LOW (ref 150–440)
RBC: 3.65 10*6/uL — AB (ref 4.40–5.90)
RDW: 17.4 % — AB (ref 11.5–14.5)
WBC: 8 x10 3/mm (ref 3.8–10.6)

## 2014-01-08 LAB — MAGNESIUM: Magnesium: 1.9 mg/dL

## 2014-01-20 ENCOUNTER — Encounter: Payer: Self-pay | Admitting: Oncology

## 2014-01-28 ENCOUNTER — Ambulatory Visit: Payer: Self-pay | Admitting: Oncology

## 2014-01-28 ENCOUNTER — Encounter: Payer: Self-pay | Admitting: Oncology

## 2014-02-03 ENCOUNTER — Encounter: Payer: Self-pay | Admitting: General Surgery

## 2014-02-03 ENCOUNTER — Ambulatory Visit (INDEPENDENT_AMBULATORY_CARE_PROVIDER_SITE_OTHER): Payer: BC Managed Care – PPO | Admitting: General Surgery

## 2014-02-03 VITALS — BP 122/88 | HR 78 | Resp 12 | Ht 66.0 in | Wt 209.0 lb

## 2014-02-03 DIAGNOSIS — C187 Malignant neoplasm of sigmoid colon: Secondary | ICD-10-CM

## 2014-02-03 NOTE — Progress Notes (Signed)
Patient ID: Paul Irwin, male   DOB: 1956/02/20, 58 y.o.   MRN: [id number]  Chief Complaint  Patient presents with  . Follow-up    colon cancer    HPI Paul Irwin is a 58 y.o. male here today for his six month colon cancer follow up. The patient has completed chemotherapy. Patient states he is doing well at this time. He is currently having physical therapy for neuropathy. Bowels are regular. The patient reports his taste buds have not returned, and this accounts for the majority of his weight loss. He is now one month out from his final treatment.   The patient is accompanied today by his wife who was present for the interview and exam.  HPI  Past Medical History  Diagnosis Date  . Sleep apnea   . Malignant neoplasm of sigmoid colon     T3,N1a. Moderately well differentiated.  1 / 13 nodes positive; adjuvant chemotherapy.     Past Surgical History  Procedure Laterality Date  . Colonoscopy  06/10/13    Dr. Vira Agar  . Wrist surgery Right 2001  . Sigmoidoscopy  06/20/13    Family History  Problem Relation Age of Onset  . Heart disease Mother   . Heart disease Father     Social History History  Substance Use Topics  . Smoking status: Never Smoker   . Smokeless tobacco: Never Used  . Alcohol Use: No    No Known Allergies  Current Outpatient Prescriptions  Medication Sig Dispense Refill  . gabapentin (NEURONTIN) 100 MG capsule Take 100 mg by mouth as needed.   2   No current facility-administered medications for this visit.    Review of Systems Review of Systems  Constitutional: Negative.   Respiratory: Negative.   Cardiovascular: Negative.   Gastrointestinal: Negative.     Blood pressure 122/88, pulse 78, resp. rate 12, height 5\' 6"  (1.676 m), weight 209 lb (94.802 kg).  Physical Exam Physical Exam  Constitutional: He is oriented to person, place, and time. He appears well-developed and well-nourished.  Cardiovascular: Normal rate, regular rhythm and  normal heart sounds.   No murmur heard. Pulmonary/Chest: Effort normal and breath sounds normal.  Abdominal: Soft. Normal appearance and bowel sounds are normal. There is no hepatosplenomegaly. There is no tenderness. No hernia.  Neurological: He is alert and oriented to person, place, and time.  Skin: Skin is warm and dry.    Data Reviewed Medical oncology notes.  Assessment    Slow recovery status post adjuvant chemotherapy for T3, N1 carcinoma sigmoid colon.    Plan    The patient is a candidate for a follow-up colonoscopy in 2016. He requested that this be completed through this office. His request will be honored. We'll arrange for a follow-up exam in 6 months prior to that procedure.     PCP:  Sol Passer 02/03/2014, 2:44 PM

## 2014-02-03 NOTE — Patient Instructions (Addendum)
The patient is aware to call back for any questions or concerns. Patient to return in 6 months for follow up.

## 2014-02-28 ENCOUNTER — Ambulatory Visit: Payer: Self-pay | Admitting: Oncology

## 2014-02-28 ENCOUNTER — Encounter: Payer: Self-pay | Admitting: Oncology

## 2014-03-05 ENCOUNTER — Encounter: Payer: Self-pay | Admitting: General Surgery

## 2014-03-31 ENCOUNTER — Encounter: Payer: Self-pay | Admitting: Oncology

## 2014-04-11 ENCOUNTER — Ambulatory Visit: Payer: Self-pay | Admitting: Oncology

## 2014-04-29 ENCOUNTER — Ambulatory Visit
Admit: 2014-04-29 | Disposition: A | Discharge status: Home / Self Care | Payer: Self-pay | Attending: Oncology | Admitting: Oncology

## 2014-06-21 NOTE — Discharge Summary (Signed)
PATIENT NAME:  ARDA, KEADLE MR#:  [id number] DATE OF BIRTH:  21-Jan-1956  DATE OF ADMISSION:  06/21/2013 DATE OF DISCHARGE:  06/24/2013  DISCHARGE DIAGNOSIS: Carcinoma of the sigmoid colon.   CLINICAL NOTE: This patient underwent screening colonoscopy, with identification of an adenocarcinoma of the sigmoid colon encompassing approximately 50% of the bowel lumen. Metastatic workup, including CT scan, was normal. Preoperative CEA was 3.0. He was felt to be a candidate for colon resection.   The patient was taken to the operating room on the morning of April 24, at which time he underwent a laparoscopic-assisted sigmoid colectomy. The patient ambulated early and often. He made use of his incentive spirometer. He experienced no impairment in GI function, with first bowel movement within 24 hours of surgery. He was begun on a clear liquid diet, and this was advanced carefully over the following 72 hours. He tolerated this without abdominal pain, nausea or vomiting. He maintained a clear cardiopulmonary exam. The pathology showed evidence of a 3.6 cm moderately differentiated adenocarcinoma with extension through the muscularis propria and to adjacent adipose tissue. All margins were negative. One of 13 lymph nodes was positive for metastatic disease, pT3, pN1a, M0.   The patient was discharged home on the morning of postoperative day 3. He was given written instructions in regards to wound care, diet and activity. Arrangements have been made for followup in my office in 7 to 10 days.   ____________________________ Robert Bellow, MD jwb:lb D: 07/09/2013 09:37:35 ET T: 07/09/2013 10:06:43 ET JOB#: [id number]  cc: Robert Bellow, MD, <Dictator> Ocie Cornfield. Ouida Sills, MD Manya Silvas, MD Martie Lee. Oliva Bustard, MD Connee Ikner Amedeo Kinsman MD ELECTRONICALLY SIGNED 07/09/2013 10:47

## 2014-06-21 NOTE — Op Note (Signed)
PATIENT NAME:  Paul Irwin, Paul Irwin MR#:  [id number] DATE OF BIRTH:  1955/12/19  DATE OF PROCEDURE:  07/15/2013  PREOPERATIVE DIAGNOSIS: Colon cancer with need for durable venous access for chemotherapy.  POSTOPERATIVE DIAGNOSIS: Colon cancer with need for durable venous access for chemotherapy.  PROCEDURES:  1.  Ultrasound guidance for vascular access, right internal jugular vein.  2.  Fluoroscopic guidance for placement of catheter.  3.  Placement of CT compatible Port-A-Cath, right internal jugular vein.   SURGEON:  Leotis Pain, MD  ANESTHESIA:  Local with moderate conscious sedation.   FLUOROSCOPY TIME:  Less than 1 minute.   CONTRAST:  Zero.   ESTIMATED BLOOD LOSS:  Minimal.  INDICATION FOR PROCEDURE:  This is a 59 year old gentleman who just had a colon resection for colon cancer.  He needs a Port-A-Cath for chemotherapy. Risks and benefits were discussed. Informed consent was obtained.   DESCRIPTION OF THE PROCEDURE:  The patient was brought to the vascular and interventional radiology suite. The right neck and chest were sterilely prepped and draped, and a sterile surgical field was created. Ultrasound was used to help visualize a patent right internal jugular vein. This was then accessed under direct ultrasound guidance without difficulty with a Seldinger needle and a permanent image was recorded. A J-wire was placed. After skin nick and dilatation, the peel-away sheath was then placed over the wire. I then anesthetized an area under the clavicle approximately 2 fingerbreadths. A transverse incision was created and an inferior pocket was created with electrocautery and blunt dissection. The port was then brought onto the field, placed into the pocket and secured to the chest wall with 2 Prolene sutures. The catheter was connected to the port and tunneled from the subclavicular incision to the access site. Fluoroscopic guidance was used to cut the catheter to an appropriate length. The  catheter was then placed through the peel-away sheath and the peel-away sheath was removed. The catheter tip was parked in excellent location in the distal superior vena cava. The pocket was then irrigated with antibiotic-impregnated saline and the wound was closed with a running 3-0 Vicryl and a 4-0 Monocryl. The access incision was closed with a single 4-0 Monocryl. The Huber needle was used to withdraw blood and flush the port with heparinized saline. Dermabond was then placed as a dressing. The patient tolerated the procedure well and was taken to the recovery room in stable condition.   ____________________________ Algernon Huxley, MD jsd:sb D: 07/15/2013 12:41:48 ET T: 07/15/2013 13:08:07 ET JOB#: [id number]  cc: Algernon Huxley, MD, <Dictator> Algernon Huxley MD ELECTRONICALLY SIGNED 07/29/2013 14:44

## 2014-06-21 NOTE — Op Note (Signed)
PATIENT NAME:  Paul Irwin, Paul Irwin MR#:  [id number] DATE OF BIRTH:  05-02-1955  DATE OF PROCEDURE:  06/21/2013  PREOPERATIVE DIAGNOSIS: Carcinoma of the sigmoid colon.   POSTOPERATIVE DIAGNOSIS: Carcinoma of the sigmoid colon.   OPERATIVE PROCEDURE: Laparoscopic-assisted sigmoid colectomy.   OPERATING SURGEON: Robert Bellow, MD  ASSISTANT: Synthia Innocent. Jamal Collin, MD   ANESTHESIA: General endotracheal under Dr. Myra Gianotti.   ESTIMATED BLOOD LOSS: Less than 100 mL.  FLUID REPLACEMENT: 1600 mL crystalloid.   CLINICAL NOTE: This 59 year old male recently underwent a screening colonoscopy and was found to have a large ulcerated mass in the sigmoid colon. He also underwent a polypectomy for a pedunculated polyp in the more proximal sigmoid colon. Preoperative evaluation has shown no evidence of metastatic disease. He was felt to be a candidate for formal resection.   OPERATIVE NOTE: With the patient under adequate general endotracheal anesthesia and having received Entereg and Invanz preoperatively as well as TED stockings and pneumatic compression stockings for DVT prophylaxis, he underwent general endotracheal anesthesia without difficulty. He was placed in the dorsal lithotomy position and a Foley catheter was placed by the nurse. Clear urine was noted. The abdomen was prepped with DuraPrep and the perineum prepped with Betadine. The wounds were draped and in Trendelenburg position, a Veress needle was placed through a transumbilical incision. After assuring intra-abdominal location with the hanging drop test, a pneumoperitoneum was established with 10 mmHg pressure of CO2. A 10 mm step port was expanded. A 12 mm port was placed in the right lower quadrant and a 10 mm port in the right upper quadrant and a 10 mm port in the left lower quadrant. The patient had a generous layer of extraperitoneal and intraperitoneal adipose tissue. The area of blue ink proximal to the tumor was identified cephalad to the peritoneal  reflection. The white line of Toldt was divided with the Harmonic scalpel to the proximal descending colon. In spite of being positioned on a beanbag and rolled steeply to the left in Trendelelberg position,  it was difficult to expose the pelvis and it was elected to convert to an open procedure. An infraumbilical incision was made and a wound protector placed. The skin was incised sharply and the fascia divided with cautery. After placement of the wound protector, a lap pad was placed to displace the small bowel from the pelvis. The mass was palpated about 6 or 7 cm above the peritoneal reflection in the distal sigmoid colon. No other masses were appreciated. The liver was unremarkable to palpation. The peritoneal reflection was incised with cautery, and the inferior mesenteric artery was taken down near the retroperitoneum and controlled with 2-0 silk ties. The left ureter was identified and protected. The dissection carried down to the sacral promontory and then swept anteriorly. An area approximately 4 or 5 cm from the palpable mass was chosen for the distal line of resection. The more proximal resection was at the junction of the proximal and mid sigmoid colon. The bowel was divided between a noncrushing and crushing clamps. The mesentery was taken down with Harmonic scalpel dissection and supplemented with 2-0 silk ties as needed. The distal resection was completed, making use of an autosuture pursestring device. The distal bowel accepted a 29 mm stapler. The stapler was passed through a proximal colotomy after placement of a pursestring suture in the proximal bowel. After appropriately aligning the bowel and closing the stapler, 2  intact donuts were obtained. The colotomy was closed with a TA 60 stapler with  3.5 mm staples. The pelvis was filled with saline and a sigmoidoscope passed per anus, with insufflation showing air bubbles from the left lateral aspect of the colocolonic anastomosis. This was  reinforced with 3-0 silk figure-of-eight suture with resolution. With re-insufflation, there was a small area of leak from the distal colotomy closure, and this was subsequently reinforced with a series of 3-0 silk figure-of-eight sutures to reinforce the entire colotomy. The pelvis was again filled with saline and the bowel pressurized, showing no further leaks. At this point, the wound protector was removed. The surgeon's gowns and gloves were changed. New instruments were brought into the field. The abdomen was irrigated a final time with saline. The peritoneum was closed with a running 2-0 Vicryl. The fascia was closed with 0 Maxon suture in figure-of-eight fashion. The generous layer of adipose tissue was closed in multiple layers with 2-0 Vicryl. The skin was closed with staples. Port sites were closed with staples.   The patient tolerated the procedure well and was taken to the recovery room in stable condition.    ____________________________ Robert Bellow, MD jwb:jcm D: 06/21/2013 18:50:04 ET T: 06/21/2013 23:26:57 ET JOB#: [id number]  cc: Robert Bellow, MD, <Dictator> Manya Silvas, MD Min Tunnell Amedeo Kinsman MD ELECTRONICALLY SIGNED 06/23/2013 11:49

## 2014-07-08 ENCOUNTER — Other Ambulatory Visit: Payer: Self-pay | Admitting: Oncology

## 2014-07-08 DIAGNOSIS — C187 Malignant neoplasm of sigmoid colon: Secondary | ICD-10-CM

## 2014-07-11 ENCOUNTER — Inpatient Hospital Stay: Payer: BLUE CROSS/BLUE SHIELD | Attending: Oncology

## 2014-07-11 ENCOUNTER — Inpatient Hospital Stay (HOSPITAL_BASED_OUTPATIENT_CLINIC_OR_DEPARTMENT_OTHER): Payer: BLUE CROSS/BLUE SHIELD | Admitting: Oncology

## 2014-07-11 ENCOUNTER — Encounter: Payer: Self-pay | Admitting: Oncology

## 2014-07-11 ENCOUNTER — Inpatient Hospital Stay: Payer: BLUE CROSS/BLUE SHIELD

## 2014-07-11 VITALS — BP 146/90 | HR 75 | Temp 96.3°F | Resp 18 | Wt 233.2 lb

## 2014-07-11 DIAGNOSIS — Z79899 Other long term (current) drug therapy: Secondary | ICD-10-CM | POA: Insufficient documentation

## 2014-07-11 DIAGNOSIS — I4891 Unspecified atrial fibrillation: Secondary | ICD-10-CM | POA: Diagnosis not present

## 2014-07-11 DIAGNOSIS — C189 Malignant neoplasm of colon, unspecified: Secondary | ICD-10-CM

## 2014-07-11 DIAGNOSIS — R197 Diarrhea, unspecified: Secondary | ICD-10-CM

## 2014-07-11 DIAGNOSIS — Z85038 Personal history of other malignant neoplasm of large intestine: Secondary | ICD-10-CM | POA: Insufficient documentation

## 2014-07-11 DIAGNOSIS — G4730 Sleep apnea, unspecified: Secondary | ICD-10-CM

## 2014-07-11 DIAGNOSIS — C187 Malignant neoplasm of sigmoid colon: Secondary | ICD-10-CM

## 2014-07-11 DIAGNOSIS — K219 Gastro-esophageal reflux disease without esophagitis: Secondary | ICD-10-CM | POA: Insufficient documentation

## 2014-07-11 DIAGNOSIS — G629 Polyneuropathy, unspecified: Secondary | ICD-10-CM

## 2014-07-11 DIAGNOSIS — Z87442 Personal history of urinary calculi: Secondary | ICD-10-CM | POA: Insufficient documentation

## 2014-07-11 LAB — CBC WITH DIFFERENTIAL/PLATELET
BASOS ABS: 0 10*3/uL (ref 0–0.1)
BASOS PCT: 0 %
EOS PCT: 2 %
Eosinophils Absolute: 0.1 10*3/uL (ref 0–0.7)
HCT: 42.4 % (ref 40.0–52.0)
HEMOGLOBIN: 14.3 g/dL (ref 13.0–18.0)
LYMPHS ABS: 1.5 10*3/uL (ref 1.0–3.6)
Lymphocytes Relative: 24 %
MCH: 28.4 pg (ref 26.0–34.0)
MCHC: 33.7 g/dL (ref 32.0–36.0)
MCV: 84.4 fL (ref 80.0–100.0)
MONO ABS: 0.5 10*3/uL (ref 0.2–1.0)
Monocytes Relative: 8 %
NEUTROS ABS: 4 10*3/uL (ref 1.4–6.5)
Neutrophils Relative %: 66 %
Platelets: 148 10*3/uL — ABNORMAL LOW (ref 150–440)
RBC: 5.03 MIL/uL (ref 4.40–5.90)
RDW: 15.3 % — ABNORMAL HIGH (ref 11.5–14.5)
WBC: 6.1 10*3/uL (ref 3.8–10.6)

## 2014-07-11 MED ORDER — HEPARIN SOD (PORK) LOCK FLUSH 100 UNIT/ML IV SOLN
INTRAVENOUS | Status: AC
  Filled 2014-07-11: qty 5

## 2014-07-12 LAB — CEA: CEA: 0.9 ng/mL (ref 0.0–4.7)

## 2014-07-30 NOTE — Progress Notes (Signed)
[facility]  Telephone:([phone]) [phone] Fax:(336) [phone]  ID: Paul Irwin OB: Jul 08, 1955  MR#: [id number]  ERX#:[id number]  Patient Care Team: [name], MD as PCP - General (Internal Medicine) Robert Bellow, MD (General Surgery)  CHIEF COMPLAINT:  Chief Complaint  Patient presents with  . Follow-up    colon cancer    INTERVAL HISTORY: Patient returns to clinic today for repeat laboratory work and routine 3 month evaluation. He continues to have peripheral neuropathy, but it continues to improve. He does not complain of weakness and fatigue today. He has no other neurologic complaints.  He denies any recent fevers.  He denies any chest pain or shortness of breath.  He denies any nausea, vomiting, constipation, or diarrhea. He has no melena or hematochezia.  Patient offers no further specific complaints today.  REVIEW OF SYSTEMS:   Review of Systems  Constitutional: Negative.   Neurological: Positive for sensory change.    As per HPI. Otherwise, a complete review of systems is negatve.  PAST MEDICAL HISTORY: Past Medical History  Diagnosis Date  . Sleep apnea   . Malignant neoplasm of sigmoid colon     T3,N1a. Moderately well differentiated.  1 / 13 nodes positive; adjuvant chemotherapy.   . Atrial fibrillation   . GERD (gastroesophageal reflux disease)   . Kidney stones     PAST SURGICAL HISTORY: Past Surgical History  Procedure Laterality Date  . Colonoscopy  06/10/13    Dr. Vira Agar  . Wrist surgery Right 2001  . Sigmoidoscopy  06/20/13    FAMILY HISTORY Family History  Problem Relation Age of Onset  . Heart disease Mother   . Heart disease Father        ADVANCED DIRECTIVES:    HEALTH MAINTENANCE: History  Substance Use Topics  . Smoking status: Never Smoker   . Smokeless tobacco: Never Used  . Alcohol Use: No     Colonoscopy:  PAP:  Bone density:  Lipid panel:  No Known Allergies  Current Outpatient  Prescriptions  Medication Sig Dispense Refill  . gabapentin (NEURONTIN) 100 MG capsule Take 100 mg by mouth as needed.   2  . gabapentin (NEURONTIN) 100 MG capsule Take 2 capsules by mouth at bedtime.     No current facility-administered medications for this visit.    OBJECTIVE: Filed Vitals:   07/11/14 1006  BP: 146/90  Pulse: 75  Temp: 96.3 F (35.7 C)  Resp: 18     Body mass index is 37.66 kg/(m^2).    ECOG FS:0 - Asymptomatic  General: Well-developed, well-nourished, no acute distress. Eyes: anicteric sclera. HEENT: Normocephalic, moist mucous membranes, clear oropharnyx. Lungs: Clear to auscultation bilaterally. Heart: Regular rate and rhythm. No rubs, murmurs, or gallops. Abdomen: Soft, nontender, nondistended. No organomegaly noted, normoactive bowel sounds. Musculoskeletal: No edema, cyanosis, or clubbing. Neuro: Alert, answering all questions appropriately. Cranial nerves grossly intact. Skin: No rashes or petechiae noted. Psych: Normal affect.   LAB RESULTS:  Lab Results  Component Value Date   NA 139 01/08/2014   K 3.5 01/08/2014   CL 103 01/08/2014   CO2 27 01/08/2014   GLUCOSE 123* 01/08/2014   BUN 12 01/08/2014   CREATININE 1.18 01/08/2014   CALCIUM 8.9 01/08/2014   PROT 6.5 01/08/2014   ALBUMIN 3.6 01/08/2014   AST 34 01/08/2014   ALT 41 01/08/2014   ALKPHOS 119* 01/08/2014   GFRNONAA >60 11/13/2013   GFRAA >60 11/13/2013    Lab Results  Component Value Date  WBC 6.1 07/11/2014   NEUTROABS 4.0 07/11/2014   HGB 14.3 07/11/2014   HCT 42.4 07/11/2014   MCV 84.4 07/11/2014   PLT 148* 07/11/2014     STUDIES: No results found.  ASSESSMENT: Stage IIIb adenocarcinoma of the colon  PLAN:    1.  Colon cancer:  No evidence of disease. CT scan in December 2015 did not reveal evidence of recurrence. No further imaging is necessary unless there is suspicion. Patient's CEA is also within normal limits. No intervention is needed at this time.  Patient will require a repeat colonoscopy 6-12 months after completion of his chemotherapy which was in November 2015. Return to clinic in 3 months with repeat laboratory work and further evaluation.  2.  Diarrhea: Patient does not complain of this today.  Continue Imodium as needed. 3.  Weakness: Resolved. 4.  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 Continue gabapentin 300 mg 3 times per day.   Patient expressed understanding and was in agreement with this plan. He also understands that He can call clinic at any time with any questions, concerns, or complaints.   No matching staging information was found for the patient.  [name], MD   07/30/2014 1:00 PM

## 2014-08-04 ENCOUNTER — Ambulatory Visit (INDEPENDENT_AMBULATORY_CARE_PROVIDER_SITE_OTHER): Payer: BLUE CROSS/BLUE SHIELD | Admitting: General Surgery

## 2014-08-04 ENCOUNTER — Encounter: Payer: Self-pay | Admitting: General Surgery

## 2014-08-04 VITALS — BP 134/78 | HR 68 | Resp 14 | Ht 66.0 in | Wt 233.0 lb

## 2014-08-04 DIAGNOSIS — C187 Malignant neoplasm of sigmoid colon: Secondary | ICD-10-CM

## 2014-08-04 MED ORDER — POLYETHYLENE GLYCOL 3350 17 GM/SCOOP PO POWD: 1.0000 | Freq: Once | ORAL | Status: DC

## 2014-08-04 NOTE — Progress Notes (Signed)
Patient ID: Paul Irwin, male   DOB: 11-04-1955, 59 y.o.   MRN: [id number]  Chief Complaint  Patient presents with  . Follow-up    colon cancer    HPI Paul Irwin is a 59 y.o. male here today for colon cancer follow up. Patient reports doing well, since completing chemotherapy. Patient states moves bowels every other morning and no GI trouble. The patient reports she's resumed all regular activities. He is working full-time. He is accompanied today by his wife, Paul Irwin. HPI  Past Medical History  Diagnosis Date  . Sleep apnea   . Malignant neoplasm of sigmoid colon     T3,N1a. Moderately well differentiated.  1 / 13 nodes positive; adjuvant chemotherapy.   . Atrial fibrillation   . GERD (gastroesophageal reflux disease)   . Kidney stones     Past Surgical History  Procedure Laterality Date  . Colonoscopy  06/10/13    Dr. Vira Agar  . Wrist surgery Right 2001  . Sigmoidoscopy  06/20/13    Family History  Problem Relation Age of Onset  . Heart disease Mother   . Heart disease Father     Social History History  Substance Use Topics  . Smoking status: Never Smoker   . Smokeless tobacco: Never Used  . Alcohol Use: No    No Known Allergies  Current Outpatient Prescriptions  Medication Sig Dispense Refill  . gabapentin (NEURONTIN) 100 MG capsule Take 2 capsules by mouth at bedtime.    . polyethylene glycol powder (GLYCOLAX/MIRALAX) powder Take 255 g by mouth once. 255 g 0   No current facility-administered medications for this visit.    Review of Systems Review of Systems  Constitutional: Negative.   Respiratory: Negative.   Cardiovascular: Negative.     Blood pressure 134/78, pulse 68, resp. rate 14, height 5\' 6"  (1.676 m), weight 233 lb (105.688 kg). The patient's weight is up 24 pounds from his last visit. Physical Exam Physical Exam  Constitutional: He is oriented to person, place, and time. He appears well-developed and well-nourished.  Cardiovascular:  Normal rate and regular rhythm.   Pulmonary/Chest: Effort normal and breath sounds normal.  Abdominal: Soft. Bowel sounds are normal.    Neurological: He is alert and oriented to person, place, and time.  Skin: Skin is warm and dry.    Data Reviewed Serum CEA May 2016: 0.9.  Assessment    Doing well status post colon resection. Residual neuropathy status post adjuvant chemotherapy.    Plan    Indication for a follow-up colonoscopy in one year was reviewed. The patient has elected to have this completed through this office.     Colonoscopy with possible biopsy/polypectomy prn: Information regarding the procedure, including its potential risks and complications (including but not limited to perforation of the bowel, which may require emergency surgery to repair, and bleeding) was verbally given to the patient. Educational information regarding lower instestinal endoscopy was given to the patient. Written instructions for how to complete the bowel prep using Miralax were provided. The importance of drinking ample fluids to avoid dehydration as a result of the prep emphasized.   Patient is scheduled for a colonoscopy at [facility] on 09/09/14. He is aware to pre register with the hospital at least 2 days prior.  Miralax prescription has been sent into his pharmacy. Patient is aware of date and instructions.   Plan a date to remove port [id number]   Paul Irwin 08/05/2014, 8:16 AM

## 2014-08-04 NOTE — Patient Instructions (Addendum)
Colonoscopy A colonoscopy is an exam to look at the entire large intestine (colon). This exam can help find problems such as tumors, polyps, inflammation, and areas of bleeding. The exam takes about 1 hour.  LET [facility] CARE PROVIDER KNOW ABOUT:   Any allergies you have.  All medicines you are taking, including vitamins, herbs, eye drops, creams, and over-the-counter medicines.  Previous problems you or members of your family have had with the use of anesthetics.  Any blood disorders you have.  Previous surgeries you have had.  Medical conditions you have. RISKS AND COMPLICATIONS  Generally, this is a safe procedure. However, as with any procedure, complications can occur. Possible complications include:  Bleeding.  Tearing or rupture of the colon wall.  Reaction to medicines given during the exam.  Infection (rare). BEFORE THE PROCEDURE   Ask your health care provider about changing or stopping your regular medicines.  You may be prescribed an oral bowel prep. This involves drinking a large amount of medicated liquid, starting the day before your procedure. The liquid will cause you to have multiple loose stools until your stool is almost clear or light green. This cleans out your colon in preparation for the procedure.  Do not eat or drink anything else once you have started the bowel prep, unless your health care provider tells you it is safe to do so.  Arrange for someone to drive you home after the procedure. PROCEDURE   You will be given medicine to help you relax (sedative).  You will lie on your side with your knees bent.  A long, flexible tube with a light and camera on the end (colonoscope) will be inserted through the rectum and into the colon. The camera sends video back to a computer screen as it moves through the colon. The colonoscope also releases carbon dioxide gas to inflate the colon. This helps your health care provider see the area better.  During  the exam, your health care provider may take a small tissue sample (biopsy) to be examined under a microscope if any abnormalities are found.  The exam is finished when the entire colon has been viewed. AFTER THE PROCEDURE   Do not drive for 24 hours after the exam.  You may have a small amount of blood in your stool.  You may pass moderate amounts of gas and have mild abdominal cramping or bloating. This is caused by the gas used to inflate your colon during the exam.  Ask when your test results will be ready and how you will get your results. Make sure you get your test results. Document Released: 02/12/2000 Document Revised: 12/05/2012 Document Reviewed: 10/22/2012 [facility] Patient Information 2015 [place], [place]. This information is not intended to replace advice given to you by your health care provider. Make sure you discuss any questions you have with your health care provider.  Patient is scheduled for a colonoscopy at [facility] on 09/09/14. He is aware to pre register with the hospital at least 2 days prior.  Miralax prescription has been sent into his pharmacy. Patient is aware of date and instructions.

## 2014-08-05 NOTE — H&P (Signed)
Patient ID: Paul Irwin, male DOB: Aug 06, 1955, 59 y.o. MRN: [id number]  Chief Complaint   Patient presents with   .  Follow-up     colon cancer    HPI  Paul Irwin is a 59 y.o. male here today for colon cancer follow up. Patient reports doing well, since completing chemotherapy. Patient states moves bowels every other morning and no GI trouble. The patient reports she's resumed all regular activities. He is working full-time. He is accompanied today by his wife, Paul Irwin.  HPI  Past Medical History   Diagnosis  Date   .  Sleep apnea    .  Malignant neoplasm of sigmoid colon      T3,N1a. Moderately well differentiated. 1 / 13 nodes positive; adjuvant chemotherapy.   .  Atrial fibrillation    .  GERD (gastroesophageal reflux disease)    .  Kidney stones     Past Surgical History   Procedure  Laterality  Date   .  Colonoscopy   06/10/13     Dr. Vira Agar   .  Wrist surgery  Right  2001   .  Sigmoidoscopy   06/20/13    Family History   Problem  Relation  Age of Onset   .  Heart disease  Mother    .  Heart disease  Father     Social History  History   Substance Use Topics   .  Smoking status:  Never Smoker   .  Smokeless tobacco:  Never Used   .  Alcohol Use:  No    No Known Allergies  Current Outpatient Prescriptions   Medication  Sig  Dispense  Refill   .  gabapentin (NEURONTIN) 100 MG capsule  Take 2 capsules by mouth at bedtime.     .  polyethylene glycol powder (GLYCOLAX/MIRALAX) powder  Take 255 g by mouth once.  255 g  0    No current facility-administered medications for this visit.    Review of Systems  Review of Systems  Constitutional: Negative.  Respiratory: Negative.  Cardiovascular: Negative.   Blood pressure 134/78, pulse 68, resp. rate 14, height 5\' 6"  (1.676 m), weight 233 lb (105.688 kg).  The patient's weight is up 24 pounds from his last visit.  Physical Exam  Physical Exam  Constitutional: He is oriented to person, place, and time. He appears  well-developed and well-nourished.  Cardiovascular: Normal rate and regular rhythm.  Pulmonary/Chest: Effort normal and breath sounds normal.  Abdominal: Soft. Bowel sounds are normal.    Neurological: He is alert and oriented to person, place, and time.  Skin: Skin is warm and dry.   Data Reviewed  Serum CEA May 2016: 0.9.  Assessment   Doing well status post colon resection. Residual neuropathy status post adjuvant chemotherapy.   Plan   Indication for a follow-up colonoscopy in one year was reviewed. The patient has elected to have this completed through this office.   Colonoscopy with possible biopsy/polypectomy prn: Information regarding the procedure, including its potential risks and complications (including but not limited to perforation of the bowel, which may require emergency surgery to repair, and bleeding) was verbally given to the patient. Educational information regarding lower instestinal endoscopy was given to the patient. Written instructions for how to complete the bowel prep using Miralax were provided. The importance of drinking ample fluids to avoid dehydration as a result of the prep emphasized.  Patient is scheduled for a colonoscopy at [facility] on 09/09/14. He  is aware to pre register with the hospital at least 2 days prior. Miralax prescription has been sent into his pharmacy. Patient is aware of date and instructions.  Plan a date to remove port  [id number]  Robert Bellow  08/05/2014, 8:16 AM

## 2014-08-05 NOTE — Addendum Note (Signed)
Addended by: Robert Bellow on: 08/05/2014 08:20 AM   Modules accepted: Level of Service

## 2014-09-04 ENCOUNTER — Telehealth: Payer: Self-pay | Admitting: *Deleted

## 2014-09-04 NOTE — Telephone Encounter (Signed)
Spoke with wife today and she confirmed patient has not had a change in medications since last office visit. Also, reports they have picked up Miralax prescription.  We will proceed with colonoscopy that is scheduled at [facility] for 09-09-14. Patient's wife instructed to call the office if they have further questions.

## 2014-09-09 ENCOUNTER — Encounter: Payer: Self-pay | Admitting: Anesthesiology

## 2014-09-09 ENCOUNTER — Ambulatory Visit: Payer: BLUE CROSS/BLUE SHIELD | Admitting: Anesthesiology

## 2014-09-09 ENCOUNTER — Ambulatory Visit
Admission: RE | Admit: 2014-09-09 | Discharge: 2014-09-09 | Disposition: A | Discharge status: Home / Self Care | Payer: BLUE CROSS/BLUE SHIELD | Source: Ambulatory Visit | Attending: General Surgery | Admitting: General Surgery

## 2014-09-09 ENCOUNTER — Encounter
Admission: RE | Disposition: A | Discharge status: Home / Self Care | Payer: Self-pay | Source: Ambulatory Visit | Attending: General Surgery

## 2014-09-09 DIAGNOSIS — Z87442 Personal history of urinary calculi: Secondary | ICD-10-CM | POA: Diagnosis not present

## 2014-09-09 DIAGNOSIS — K219 Gastro-esophageal reflux disease without esophagitis: Secondary | ICD-10-CM | POA: Diagnosis not present

## 2014-09-09 DIAGNOSIS — I4891 Unspecified atrial fibrillation: Secondary | ICD-10-CM | POA: Diagnosis not present

## 2014-09-09 DIAGNOSIS — Z85038 Personal history of other malignant neoplasm of large intestine: Secondary | ICD-10-CM | POA: Insufficient documentation

## 2014-09-09 DIAGNOSIS — C187 Malignant neoplasm of sigmoid colon: Secondary | ICD-10-CM

## 2014-09-09 DIAGNOSIS — G4730 Sleep apnea, unspecified: Secondary | ICD-10-CM | POA: Insufficient documentation

## 2014-09-09 DIAGNOSIS — Z09 Encounter for follow-up examination after completed treatment for conditions other than malignant neoplasm: Secondary | ICD-10-CM | POA: Insufficient documentation

## 2014-09-09 DIAGNOSIS — Z9221 Personal history of antineoplastic chemotherapy: Secondary | ICD-10-CM | POA: Diagnosis not present

## 2014-09-09 DIAGNOSIS — I252 Old myocardial infarction: Secondary | ICD-10-CM | POA: Insufficient documentation

## 2014-09-09 HISTORY — PX: COLONOSCOPY: SHX5424

## 2014-09-09 SURGERY — COLONOSCOPY
Anesthesia: General

## 2014-09-09 MED ORDER — SODIUM CHLORIDE 0.9 % IV SOLN: INTRAVENOUS | Status: DC

## 2014-09-09 MED ORDER — PROPOFOL INFUSION 10 MG/ML OPTIME
INTRAVENOUS | Status: DC | PRN
  Administered 2014-09-09: 140 ug/kg/min via INTRAVENOUS

## 2014-09-09 MED ORDER — FENTANYL CITRATE (PF) 100 MCG/2ML IJ SOLN
INTRAMUSCULAR | Status: DC | PRN
Start: 2014-09-09 — End: 2014-09-09
  Administered 2014-09-09: 50 ug via INTRAVENOUS
  Administered 2014-09-09: 25 ug via INTRAVENOUS

## 2014-09-09 MED ORDER — SODIUM CHLORIDE 0.9 % IV SOLN
1000.0000 mL | INTRAVENOUS | Status: DC
  Administered 2014-09-09: 13:00:00 via INTRAVENOUS

## 2014-09-09 MED ORDER — MIDAZOLAM HCL 2 MG/2ML IJ SOLN
INTRAMUSCULAR | Status: DC | PRN
  Administered 2014-09-09: 2 mg via INTRAVENOUS

## 2014-09-09 NOTE — Transfer of Care (Signed)
Immediate Anesthesia Transfer of Care Note  Patient: Paul Irwin  Procedure(s) Performed: Procedure(s): COLONOSCOPY (N/A)  Patient Location: PACU  Anesthesia Type:General  Level of Consciousness: awake and sedated  Airway & Oxygen Therapy: Patient Spontanous Breathing  Post-op Assessment: Report given to RN and Post -op Vital signs reviewed and stable  Post vital signs: Reviewed and stable  Last Vitals:  Filed Vitals:   09/09/14 1259  BP: 132/100  Pulse: 99  Temp: 37.4 C  Resp: 20    Complications: No apparent anesthesia complications

## 2014-09-09 NOTE — Op Note (Signed)
[facility] [place] [facility] Patient Name: Paul Irwin Procedure Date: 09/09/2014 1:40 PM MRN: [id number] Account #: [id number] Date of Birth: 1955/10/08 Admit Type: Outpatient Age: 59 Room: [facility] ENDO ROOM 4 Gender: Male Note Status: Finalized Procedure:         Colonoscopy Indications:       High risk colon cancer surveillance: Personal history of                     colon cancer Providers:         Robert Bellow, MD Referring MD:      Ocie Cornfield. Ouida Sills, MD (Referring MD) Medicines:         Monitored Anesthesia Care Complications:     No immediate complications. Procedure:         Pre-Anesthesia Assessment:                    - Prior to the procedure, a History and Physical was                     performed, and patient medications, allergies and                     sensitivities were reviewed. The patient's tolerance of                     previous anesthesia was reviewed.                    - The risks and benefits of the procedure and the sedation                     options and risks were discussed with the patient. All                     questions were answered and informed consent was obtained.                    After obtaining informed consent, the colonoscope was                     passed under direct vision. Throughout the procedure, the                     patient's blood pressure, pulse, and oxygen saturations                     were monitored continuously. The Colonoscope was                     introduced through the anus and advanced to the the cecum,                     identified by appendiceal orifice and ileocecal valve. The                     colonoscopy was performed without difficulty. The patient                     tolerated the procedure well. The quality of the bowel                     preparation was excellent. Findings:      The entire examined colon appeared normal on direct and retroflexion  views. Impression:         - The entire examined colon is normal on direct and                     retroflexion views.                    - No specimens collected. Recommendation:    - Repeat colonoscopy in 3 years for surveillance. Procedure Code(s): --- Professional ---                    [phone], Colonoscopy, flexible; diagnostic, including                     collection of specimen(s) by brushing or washing, when                     performed (separate procedure) Diagnosis Code(s): --- Professional ---                    O15.615, Personal history of other malignant neoplasm of                     large intestine CPT copyright 2014 [employer]. All rights reserved. The codes documented in this report are preliminary and upon coder review may  be revised to meet current compliance requirements. Robert Bellow, MD 09/09/2014 2:08:55 PM This report has been signed electronically. Number of Addenda: 0 Note Initiated On: 09/09/2014 1:40 PM Scope Withdrawal Time: 0 hours 8 minutes 15 seconds  Total Procedure Duration: 0 hours 13 minutes 31 seconds       [facility]

## 2014-09-09 NOTE — Anesthesia Preprocedure Evaluation (Signed)
Anesthesia Evaluation  Patient identified by MRN, date of birth, ID band Patient awake    Reviewed: Allergy & Precautions, H&P , NPO status , Patient's Chart, lab work & pertinent test results, reviewed documented beta blocker date and time   Airway Mallampati: II  TM Distance: >3 FB Neck ROM: full    Dental no notable dental hx. (+) Teeth Intact   Pulmonary sleep apnea ,  breath sounds clear to auscultation  Pulmonary exam normal       Cardiovascular Exercise Tolerance: Good - Past MI Normal cardiovascular exam+ dysrhythmias Atrial Fibrillation Rhythm:regular Rate:Normal     Neuro/Psych negative neurological ROS  negative psych ROS   GI/Hepatic Neg liver ROS, GERD-  Controlled,  Endo/Other  negative endocrine ROS  Renal/GU Renal disease  negative genitourinary   Musculoskeletal   Abdominal   Peds  Hematology negative hematology ROS (+)   Anesthesia Other Findings Past Medical History:   Sleep apnea                                                  Malignant neoplasm of sigmoid colon                            Comment:T3,N1a. Moderately well differentiated.  1 / 13              nodes positive; adjuvant chemotherapy.    Atrial fibrillation                                          GERD (gastroesophageal reflux disease)                       Kidney stones                                                Reproductive/Obstetrics negative OB ROS                             Anesthesia Physical Anesthesia Plan  ASA: III  Anesthesia Plan: General   Post-op Pain Management:    Induction:   Airway Management Planned:   Additional Equipment:   Intra-op Plan:   Post-operative Plan:   Informed Consent: I have reviewed the patients History and Physical, chart, labs and discussed the procedure including the risks, benefits and alternatives for the proposed anesthesia with the patient or  authorized representative who has indicated his/her understanding and acceptance.   Dental Advisory Given  Plan Discussed with: Anesthesiologist, CRNA and Surgeon  Anesthesia Plan Comments:         Anesthesia Quick Evaluation

## 2014-09-09 NOTE — Anesthesia Procedure Notes (Signed)
Performed by: Vaughan Sine Pre-anesthesia Checklist: Emergency Drugs available, Patient identified, Suction available, Patient being monitored and Timeout performed Patient Re-evaluated:Patient Re-evaluated prior to inductionOxygen Delivery Method: Nasal cannula Preoxygenation: Pre-oxygenation with 100% oxygen Intubation Type: IV induction Placement Confirmation: positive ETCO2 and CO2 detector

## 2014-09-09 NOTE — H&P (Addendum)
One year s/p sigmoid resection for malignancy. No change in general health.  HEENT: Neg. Lungs: Clear. Cardio: RR.  Tolerated prep well. For colonoscopy today.

## 2014-09-10 ENCOUNTER — Encounter: Payer: Self-pay | Admitting: General Surgery

## 2014-09-10 ENCOUNTER — Telehealth: Payer: Self-pay | Admitting: *Deleted

## 2014-09-10 NOTE — Anesthesia Postprocedure Evaluation (Signed)
  Anesthesia Post-op Note  Patient: Paul Irwin  Procedure(s) Performed: Procedure(s): COLONOSCOPY (N/A)  Anesthesia type:General  Patient location: PACU  Post pain: Pain level controlled  Post assessment: Post-op Vital signs reviewed, Patient's Cardiovascular Status Stable, Respiratory Function Stable, Patent Airway and No signs of Nausea or vomiting  Post vital signs: Reviewed and stable  Last Vitals:  Filed Vitals:   09/09/14 1450  BP: 137/91  Pulse: 94  Temp:   Resp: 19    Level of consciousness: awake, alert  and patient cooperative  Complications: No apparent anesthesia complications

## 2014-09-10 NOTE — Telephone Encounter (Signed)
That's fine.  Ok to remove port.

## 2014-09-11 NOTE — Telephone Encounter (Signed)
Appt scheduled 09/24/14 @ 11:15 Patient is aware

## 2014-09-24 ENCOUNTER — Ambulatory Visit (INDEPENDENT_AMBULATORY_CARE_PROVIDER_SITE_OTHER): Payer: BLUE CROSS/BLUE SHIELD | Admitting: General Surgery

## 2014-09-24 ENCOUNTER — Encounter: Payer: Self-pay | Admitting: General Surgery

## 2014-09-24 VITALS — BP 134/98 | HR 96 | Resp 16 | Ht 66.0 in | Wt 234.0 lb

## 2014-09-24 DIAGNOSIS — C187 Malignant neoplasm of sigmoid colon: Secondary | ICD-10-CM | POA: Diagnosis not present

## 2014-09-24 NOTE — Patient Instructions (Signed)
The patient is aware to call back for any questions or concerns. Keep area clean 

## 2014-09-24 NOTE — Progress Notes (Signed)
Patient ID: Paul Irwin, male   DOB: Jul 01, 1955, 59 y.o.   MRN: [id number]  Chief Complaint  Patient presents with  . Procedure    port removal    HPI Paul Irwin is a 59 y.o. male.  Here today for port removal. The port had been placed by Leotis Pain, M.D.   HPI  Past Medical History  Diagnosis Date  . Sleep apnea   . Malignant neoplasm of sigmoid colon     T3,N1a. Moderately well differentiated.  1 / 13 nodes positive; adjuvant chemotherapy.   . Atrial fibrillation   . GERD (gastroesophageal reflux disease)   . Kidney stones     Past Surgical History  Procedure Laterality Date  . Colonoscopy  06/10/13    Dr. Vira Agar  . Wrist surgery Right 2001  . Sigmoidoscopy  06/20/13  . Colon surgery    . Colonoscopy N/A 09/09/2014    Procedure: COLONOSCOPY;  Surgeon: Robert Bellow, MD;  Location: [facility] ENDOSCOPY;  Service: Endoscopy;  Laterality: N/A;    Family History  Problem Relation Age of Onset  . Heart disease Mother   . Heart disease Father     Social History History  Substance Use Topics  . Smoking status: Never Smoker   . Smokeless tobacco: Never Used  . Alcohol Use: No    No Known Allergies  Current Outpatient Prescriptions  Medication Sig Dispense Refill  . gabapentin (NEURONTIN) 100 MG capsule Take 2 capsules by mouth at bedtime.     No current facility-administered medications for this visit.    Review of Systems Review of Systems  Constitutional: Negative.   Respiratory: Negative.   Cardiovascular: Negative.     Blood pressure 134/98, pulse 96, resp. rate 16, height 5\' 6"  (1.676 m), weight 234 lb (106.142 kg).  Physical Exam Physical Exam  Pulmonary/Chest:      Data Reviewed Note for port removal received from Dr. Grayland Ormond.  Assessment    Completion of adjuvant chemotherapy. No further need for central venous access.    Plan    The procedure was reviewed and the patient was amenable to proceed. His wife was present for the  procedure.  The site was prepped with chlor prep and 10 mL of 0.5% Xylocaine with 0.25% Marcaine with 1-200,000 epinephrine was utilized well tolerated. Chlor prep was again applied and the area draped. The previous incision was opened. The port was exposed and the transfixion sutures removed. The port was removed without difficulty. The catheter had been cut prior to insertion so the normal smooth tip was not evident. (Original operative note reviewed).  The wound was closed with layers of 3-0 Vicryls to the adipose tissue and a running subcuticular 3-0 Vicryls suture to the skin. Benzoin, Steri-Strips, Telfa and Tegaderm dressing were applied. Ice pack provided. Postoperative wound care instructions reviewed.  The patient will return in one week for wound evaluation with the staff.     PCP:  [name] Ref Dr. Tonny Bollman, [place] Gle[place] 09/25/2014, 8:00 PM

## 2014-10-01 ENCOUNTER — Ambulatory Visit: Payer: BLUE CROSS/BLUE SHIELD

## 2014-10-02 ENCOUNTER — Ambulatory Visit: Payer: BLUE CROSS/BLUE SHIELD

## 2014-10-06 ENCOUNTER — Ambulatory Visit (INDEPENDENT_AMBULATORY_CARE_PROVIDER_SITE_OTHER): Payer: BLUE CROSS/BLUE SHIELD | Admitting: *Deleted

## 2014-10-06 DIAGNOSIS — C187 Malignant neoplasm of sigmoid colon: Secondary | ICD-10-CM

## 2014-10-06 NOTE — Progress Notes (Signed)
Patient came in today for a wound check port placement.  The wound is clean, with no signs of infection noted. Follow up as scheduled.

## 2014-10-06 NOTE — Patient Instructions (Signed)
As scheduled

## 2014-10-07 ENCOUNTER — Telehealth: Payer: Self-pay | Admitting: *Deleted

## 2014-10-07 NOTE — Telephone Encounter (Signed)
denied °

## 2014-10-13 ENCOUNTER — Other Ambulatory Visit: Payer: Self-pay | Admitting: *Deleted

## 2014-10-13 MED ORDER — GABAPENTIN 100 MG PO CAPS: 100.0000 mg | ORAL_CAPSULE | Freq: Two times a day (BID) | ORAL | Status: DC

## 2014-10-24 ENCOUNTER — Ambulatory Visit: Payer: BLUE CROSS/BLUE SHIELD | Admitting: Oncology

## 2014-10-24 ENCOUNTER — Other Ambulatory Visit: Payer: BLUE CROSS/BLUE SHIELD

## 2014-10-31 ENCOUNTER — Other Ambulatory Visit: Payer: Self-pay | Admitting: *Deleted

## 2014-10-31 ENCOUNTER — Inpatient Hospital Stay (HOSPITAL_BASED_OUTPATIENT_CLINIC_OR_DEPARTMENT_OTHER): Payer: BLUE CROSS/BLUE SHIELD | Admitting: Oncology

## 2014-10-31 ENCOUNTER — Inpatient Hospital Stay: Payer: BLUE CROSS/BLUE SHIELD | Attending: Oncology

## 2014-10-31 VITALS — BP 144/95 | HR 63 | Temp 97.3°F | Resp 18 | Wt 235.0 lb

## 2014-10-31 DIAGNOSIS — R531 Weakness: Secondary | ICD-10-CM | POA: Insufficient documentation

## 2014-10-31 DIAGNOSIS — Z79899 Other long term (current) drug therapy: Secondary | ICD-10-CM | POA: Insufficient documentation

## 2014-10-31 DIAGNOSIS — K219 Gastro-esophageal reflux disease without esophagitis: Secondary | ICD-10-CM

## 2014-10-31 DIAGNOSIS — Z85038 Personal history of other malignant neoplasm of large intestine: Secondary | ICD-10-CM

## 2014-10-31 DIAGNOSIS — I4891 Unspecified atrial fibrillation: Secondary | ICD-10-CM | POA: Diagnosis not present

## 2014-10-31 DIAGNOSIS — Z87442 Personal history of urinary calculi: Secondary | ICD-10-CM

## 2014-10-31 DIAGNOSIS — C189 Malignant neoplasm of colon, unspecified: Secondary | ICD-10-CM

## 2014-10-31 DIAGNOSIS — G629 Polyneuropathy, unspecified: Secondary | ICD-10-CM | POA: Insufficient documentation

## 2014-10-31 DIAGNOSIS — G4730 Sleep apnea, unspecified: Secondary | ICD-10-CM

## 2014-10-31 LAB — CBC WITH DIFFERENTIAL/PLATELET
Basophils Absolute: 0.1 10*3/uL (ref 0–0.1)
Basophils Relative: 1 %
Eosinophils Absolute: 0.1 10*3/uL (ref 0–0.7)
Eosinophils Relative: 2 %
HEMATOCRIT: 44.9 % (ref 40.0–52.0)
Hemoglobin: 15.1 g/dL (ref 13.0–18.0)
LYMPHS ABS: 1.5 10*3/uL (ref 1.0–3.6)
Lymphocytes Relative: 27 %
MCH: 28.8 pg (ref 26.0–34.0)
MCHC: 33.6 g/dL (ref 32.0–36.0)
MCV: 85.7 fL (ref 80.0–100.0)
MONOS PCT: 9 %
Monocytes Absolute: 0.5 10*3/uL (ref 0.2–1.0)
NEUTROS PCT: 61 %
Neutro Abs: 3.5 10*3/uL (ref 1.4–6.5)
Platelets: 161 10*3/uL (ref 150–440)
RBC: 5.24 MIL/uL (ref 4.40–5.90)
RDW: 14.2 % (ref 11.5–14.5)
WBC: 5.7 10*3/uL (ref 3.8–10.6)

## 2014-10-31 MED ORDER — TRAZODONE HCL 150 MG PO TABS: 150.0000 mg | ORAL_TABLET | Freq: Every evening | ORAL | Status: DC | PRN

## 2014-11-01 LAB — CEA: CEA: 0.6 ng/mL (ref 0.0–4.7)

## 2014-11-03 NOTE — Progress Notes (Signed)
[place]  [phone] [phone]  ID: Paul Irwin OB: Sep 21, 1955  MR#: [id number]  XIP#:[id number]  Patient Care Team: [name], MD as PCP - General (Internal Medicine) Robert Bellow, MD (General Surgery) Self Requested  CHIEF COMPLAINT:  Chief Complaint  Patient presents with  . Follow-up    colon cancer    INTERVAL HISTORY: Patient returns to clinic today for repeat laboratory work and routine 3 month evaluation. He continues to have peripheral neuropathy, but it continues to improve. He does not complain of weakness and fatigue today. He has no other neurologic complaints.  He denies any recent fevers.  He denies any chest pain or shortness of breath.  He denies any nausea, vomiting, constipation, or diarrhea. He has no melena or hematochezia.  Patient offers no further specific complaints today.  REVIEW OF SYSTEMS:   Review of Systems  Constitutional: Negative.   Neurological: Positive for sensory change.    As per HPI. Otherwise, a complete review of systems is negatve.  PAST MEDICAL HISTORY: Past Medical History  Diagnosis Date  . Sleep apnea   . Malignant neoplasm of sigmoid colon     T3,N1a. Moderately well differentiated.  1 / 13 nodes positive; adjuvant chemotherapy.   . Atrial fibrillation   . GERD (gastroesophageal reflux disease)   . Kidney stones     PAST SURGICAL HISTORY: Past Surgical History  Procedure Laterality Date  . Colonoscopy  06/10/13    Dr. Vira Agar  . Wrist surgery Right 2001  . Sigmoidoscopy  06/20/13  . Colon surgery    . Colonoscopy N/A 09/09/2014    Procedure: COLONOSCOPY;  Surgeon: Robert Bellow, MD;  Location: [facility] ENDOSCOPY;  Service: Endoscopy;  Laterality: N/A;    FAMILY HISTORY Family History  Problem Relation Age of Onset  . Heart disease Mother   . Heart disease Father        ADVANCED DIRECTIVES:    HEALTH MAINTENANCE: Social History  Substance Use Topics    . Smoking status: Never Smoker   . Smokeless tobacco: Never Used  . Alcohol Use: No     Colonoscopy:  PAP:  Bone density:  Lipid panel:  No Known Allergies  Current Outpatient Prescriptions  Medication Sig Dispense Refill  . gabapentin (NEURONTIN) 100 MG capsule Take 1 capsule (100 mg total) by mouth 2 (two) times daily. 60 capsule 0  . traZODone (DESYREL) 150 MG tablet Take 1 tablet (150 mg total) by mouth at bedtime as needed for sleep. 30 tablet 1   No current facility-administered medications for this visit.    OBJECTIVE: Filed Vitals:   10/31/14 0936  BP: 144/95  Pulse: 63  Temp: 97.3 F (36.3 C)  Resp: 18     Body mass index is 37.95 kg/(m^2).    ECOG FS:0 - Asymptomatic  General: Well-developed, well-nourished, no acute distress. Eyes: anicteric sclera. Lungs: Clear to auscultation bilaterally. Heart: Regular rate and rhythm. No rubs, murmurs, or gallops. Abdomen: Soft, nontender, nondistended. No organomegaly noted, normoactive bowel sounds. Musculoskeletal: No edema, cyanosis, or clubbing. Neuro: Alert, answering all questions appropriately. Cranial nerves grossly intact. Skin: No rashes or petechiae noted. Psych: Normal affect.   LAB RESULTS:  Lab Results  Component Value Date   NA 139 01/08/2014   K 3.5 01/08/2014   CL 103 01/08/2014   CO2 27 01/08/2014   GLUCOSE 123* 01/08/2014   BUN 12 01/08/2014   CREATININE 1.18 01/08/2014   CALCIUM 8.9 01/08/2014  PROT 6.5 01/08/2014   ALBUMIN 3.6 01/08/2014   AST 34 01/08/2014   ALT 41 01/08/2014   ALKPHOS 119* 01/08/2014   BILITOT 0.5 01/08/2014   GFRNONAA >60 01/08/2014   GFRAA >60 01/08/2014    Lab Results  Component Value Date   WBC 5.7 10/31/2014   NEUTROABS 3.5 10/31/2014   HGB 15.1 10/31/2014   HCT 44.9 10/31/2014   MCV 85.7 10/31/2014   PLT 161 10/31/2014     STUDIES: No results found.  ASSESSMENT: Stage IIIb adenocarcinoma of the colon  PLAN:    1.  Colon cancer:  No  evidence of disease. Patient had a colonoscopy on September 09, 2014 that was reported as normal, repeat in 3 years. CT scan in December 2015 did not reveal evidence of recurrence. No further imaging is necessary unless there is suspicion. Patient's CEA is also within normal limits. No intervention is needed at this time. Return to clinic in 3 months with repeat laboratory work and further evaluation.  2.  Diarrhea: Patient does not complain of this today.  Continue Imodium as needed. 3.  Weakness: Resolved. 4.  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 Continue gabapentin as prescribed.  Patient expressed understanding and was in agreement with this plan. He also understands that He can call clinic at any time with any questions, concerns, or complaints.   No matching staging information was found for the patient.  [name], MD   11/03/2014 1:24 PM

## 2014-11-04 ENCOUNTER — Encounter: Payer: Self-pay | Admitting: Oncology

## 2014-12-22 ENCOUNTER — Telehealth: Payer: Self-pay | Admitting: *Deleted

## 2014-12-22 MED ORDER — GABAPENTIN 100 MG PO CAPS: 100.0000 mg | ORAL_CAPSULE | Freq: Two times a day (BID) | ORAL | Status: DC

## 2014-12-22 NOTE — Telephone Encounter (Signed)
Faxed

## 2015-01-21 ENCOUNTER — Telehealth: Payer: Self-pay | Admitting: *Deleted

## 2015-01-21 MED ORDER — TRAZODONE HCL 150 MG PO TABS: 150.0000 mg | ORAL_TABLET | Freq: Every evening | ORAL | Status: DC | PRN

## 2015-01-21 NOTE — Telephone Encounter (Signed)
Escribed

## 2015-01-26 ENCOUNTER — Other Ambulatory Visit: Payer: Self-pay | Admitting: *Deleted

## 2015-01-26 MED ORDER — GABAPENTIN 100 MG PO CAPS: 100.0000 mg | ORAL_CAPSULE | Freq: Two times a day (BID) | ORAL | Status: DC

## 2015-01-26 MED ORDER — TRAZODONE HCL 150 MG PO TABS: 150.0000 mg | ORAL_TABLET | Freq: Every evening | ORAL | Status: DC | PRN

## 2015-02-06 ENCOUNTER — Inpatient Hospital Stay (HOSPITAL_BASED_OUTPATIENT_CLINIC_OR_DEPARTMENT_OTHER): Payer: BLUE CROSS/BLUE SHIELD | Admitting: Oncology

## 2015-02-06 ENCOUNTER — Inpatient Hospital Stay: Payer: BLUE CROSS/BLUE SHIELD | Attending: Oncology

## 2015-02-06 VITALS — HR 78 | Temp 98.7°F | Resp 18 | Wt 241.2 lb

## 2015-02-06 DIAGNOSIS — K219 Gastro-esophageal reflux disease without esophagitis: Secondary | ICD-10-CM

## 2015-02-06 DIAGNOSIS — Z79899 Other long term (current) drug therapy: Secondary | ICD-10-CM | POA: Diagnosis not present

## 2015-02-06 DIAGNOSIS — Z87442 Personal history of urinary calculi: Secondary | ICD-10-CM | POA: Insufficient documentation

## 2015-02-06 DIAGNOSIS — Z85038 Personal history of other malignant neoplasm of large intestine: Secondary | ICD-10-CM | POA: Diagnosis not present

## 2015-02-06 DIAGNOSIS — C189 Malignant neoplasm of colon, unspecified: Secondary | ICD-10-CM

## 2015-02-06 DIAGNOSIS — G629 Polyneuropathy, unspecified: Secondary | ICD-10-CM

## 2015-02-06 DIAGNOSIS — I4891 Unspecified atrial fibrillation: Secondary | ICD-10-CM | POA: Diagnosis not present

## 2015-02-06 LAB — CBC WITH DIFFERENTIAL/PLATELET
BASOS ABS: 0.1 10*3/uL (ref 0–0.1)
BASOS PCT: 2 %
EOS ABS: 0.1 10*3/uL (ref 0–0.7)
EOS PCT: 1 %
HCT: 45 % (ref 40.0–52.0)
Hemoglobin: 15.3 g/dL (ref 13.0–18.0)
Lymphocytes Relative: 29 %
Lymphs Abs: 1.7 10*3/uL (ref 1.0–3.6)
MCH: 29 pg (ref 26.0–34.0)
MCHC: 34 g/dL (ref 32.0–36.0)
MCV: 85.3 fL (ref 80.0–100.0)
MONO ABS: 0.6 10*3/uL (ref 0.2–1.0)
Monocytes Relative: 10 %
Neutro Abs: 3.4 10*3/uL (ref 1.4–6.5)
Neutrophils Relative %: 58 %
PLATELETS: 148 10*3/uL — AB (ref 150–440)
RBC: 5.27 MIL/uL (ref 4.40–5.90)
RDW: 14.3 % (ref 11.5–14.5)
WBC: 5.8 10*3/uL (ref 3.8–10.6)

## 2015-02-06 NOTE — Progress Notes (Signed)
Patient still having neuropathy in fingers and toes.

## 2015-02-07 LAB — CEA: CEA: 0.7 ng/mL (ref 0.0–4.7)

## 2015-02-27 NOTE — Progress Notes (Signed)
[name]  Telephone:(336) [phone] [phone]) [phone]  ID: Paul Irwin OB: 10/21/1955  MR#: [id number]  [id number]  Patient Care Team: [name], MD as PCP - General (Internal Medicine) Robert Bellow, MD (General Surgery) Self Requested  CHIEF COMPLAINT:  Chief Complaint  Patient presents with  . Colon Cancer    INTERVAL HISTORY: Patient returns to clinic today for repeat laboratory work and routine 3 month evaluation. He continues to have peripheral neuropathy in his fingers and toes, but it continues to improve. He does not complain of weakness and fatigue today. He has no other neurologic complaints.  He denies any recent fevers.  He denies any chest pain or shortness of breath.  He denies any nausea, vomiting, constipation, or diarrhea. He has no melena or hematochezia.  Patient offers no further specific complaints today.  REVIEW OF SYSTEMS:   Review of Systems  Constitutional: Negative.   Respiratory: Negative.   Cardiovascular: Negative.   Gastrointestinal: Negative.  Negative for nausea, vomiting, abdominal pain, diarrhea, constipation, blood in stool and melena.  Musculoskeletal: Negative.   Neurological: Positive for sensory change.    As per HPI. Otherwise, a complete review of systems is negatve.  PAST MEDICAL HISTORY: Past Medical History  Diagnosis Date  . Sleep apnea   . Malignant neoplasm of sigmoid colon     T3,N1a. Moderately well differentiated.  1 / 13 nodes positive; adjuvant chemotherapy.   . Atrial fibrillation   . GERD (gastroesophageal reflux disease)   . Kidney stones     PAST SURGICAL HISTORY: Past Surgical History  Procedure Laterality Date  . Colonoscopy  06/10/13    Dr. Vira Agar  . Wrist surgery Right 2001  . Sigmoidoscopy  06/20/13  . Colon surgery    . Colonoscopy N/A 09/09/2014    Procedure: COLONOSCOPY;  Surgeon: Robert Bellow, MD;  Location: [facility] ENDOSCOPY;  Service: Endoscopy;   Laterality: N/A;    FAMILY HISTORY Family History  Problem Relation Age of Onset  . Heart disease Mother   . Heart disease Father        ADVANCED DIRECTIVES:    HEALTH MAINTENANCE: Social History  Substance Use Topics  . Smoking status: Never Smoker   . Smokeless tobacco: Never Used  . Alcohol Use: No     Colonoscopy:  PAP:  Bone density:  Lipid panel:  No Known Allergies  Current Outpatient Prescriptions  Medication Sig Dispense Refill  . gabapentin (NEURONTIN) 100 MG capsule Take 1 capsule (100 mg total) by mouth 2 (two) times daily. 60 capsule 2  . traZODone (DESYREL) 150 MG tablet Take 1 tablet (150 mg total) by mouth at bedtime as needed for sleep. 90 tablet 0   No current facility-administered medications for this visit.    OBJECTIVE: Filed Vitals:   02/06/15 1027  Pulse: 78  Temp: 98.7 F (37.1 C)  Resp: 18     Body mass index is 38.95 kg/(m^2).    ECOG FS:0 - Asymptomatic  General: Well-developed, well-nourished, no acute distress. Eyes: anicteric sclera. Lungs: Clear to auscultation bilaterally. Heart: Regular rate and rhythm. No rubs, murmurs, or gallops. Abdomen: Soft, nontender, nondistended. No organomegaly noted, normoactive bowel sounds. Musculoskeletal: No edema, cyanosis, or clubbing. Neuro: Alert, answering all questions appropriately. Cranial nerves grossly intact. Skin: No rashes or petechiae noted. Psych: Normal affect.   LAB RESULTS:  Lab Results  Component Value Date   NA 139 01/08/2014   K 3.5 01/08/2014   CL 103 01/08/2014  CO2 27 01/08/2014   GLUCOSE 123* 01/08/2014   BUN 12 01/08/2014   CREATININE 1.18 01/08/2014   CALCIUM 8.9 01/08/2014   PROT 6.5 01/08/2014   ALBUMIN 3.6 01/08/2014   AST 34 01/08/2014   ALT 41 01/08/2014   ALKPHOS 119* 01/08/2014   BILITOT 0.5 01/08/2014   GFRNONAA >60 01/08/2014   GFRAA >60 01/08/2014    Lab Results  Component Value Date   WBC 5.8 02/06/2015   NEUTROABS 3.4 02/06/2015    HGB 15.3 02/06/2015   HCT 45.0 02/06/2015   MCV 85.3 02/06/2015   PLT 148* 02/06/2015     STUDIES: No results found.  ASSESSMENT: Stage IIIb adenocarcinoma of the colon  PLAN:    1.  Colon cancer:  No evidence of disease. Patient completed 12 cycles of adjuvant FOLFOX chemotherapy on January 08, 2014. Patient had a colonoscopy on September 09, 2014 that was reported as normal, repeat in 3 years. CT scan in December 2015 did not reveal evidence of recurrence. No further imaging is necessary unless there is suspicion. Patient's CEA is also within normal limits. No intervention is needed at this time. Return to clinic in 6 months with repeat laboratory work and further evaluation.  2.  Diarrhea: Patient does not complain of this today.  Continue Imodium as needed. 3.  Weakness: Resolved. 4.  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 Continue gabapentin as prescribed.  Patient expressed understanding and was in agreement with this plan. He also understands that He can call clinic at any time with any questions, concerns, or complaints.    [name], MD   02/27/2015 3:18 PM

## 2015-04-20 ENCOUNTER — Other Ambulatory Visit: Payer: Self-pay

## 2015-04-20 MED ORDER — GABAPENTIN 100 MG PO CAPS: 100.0000 mg | ORAL_CAPSULE | Freq: Two times a day (BID) | ORAL | Status: DC

## 2015-06-09 ENCOUNTER — Encounter: Payer: Self-pay | Admitting: *Deleted

## 2015-06-09 ENCOUNTER — Other Ambulatory Visit: Payer: Self-pay | Admitting: *Deleted

## 2015-06-09 DIAGNOSIS — C187 Malignant neoplasm of sigmoid colon: Secondary | ICD-10-CM

## 2015-08-07 ENCOUNTER — Inpatient Hospital Stay (HOSPITAL_BASED_OUTPATIENT_CLINIC_OR_DEPARTMENT_OTHER): Payer: BLUE CROSS/BLUE SHIELD | Admitting: Oncology

## 2015-08-07 ENCOUNTER — Inpatient Hospital Stay: Payer: BLUE CROSS/BLUE SHIELD | Attending: Oncology

## 2015-08-07 VITALS — BP 151/101 | HR 86 | Temp 97.3°F | Ht 66.0 in | Wt 235.7 lb

## 2015-08-07 DIAGNOSIS — Z85038 Personal history of other malignant neoplasm of large intestine: Secondary | ICD-10-CM | POA: Insufficient documentation

## 2015-08-07 DIAGNOSIS — Z87442 Personal history of urinary calculi: Secondary | ICD-10-CM

## 2015-08-07 DIAGNOSIS — G629 Polyneuropathy, unspecified: Secondary | ICD-10-CM | POA: Diagnosis not present

## 2015-08-07 DIAGNOSIS — K219 Gastro-esophageal reflux disease without esophagitis: Secondary | ICD-10-CM

## 2015-08-07 DIAGNOSIS — C189 Malignant neoplasm of colon, unspecified: Secondary | ICD-10-CM

## 2015-08-07 DIAGNOSIS — R197 Diarrhea, unspecified: Secondary | ICD-10-CM

## 2015-08-07 DIAGNOSIS — Z79899 Other long term (current) drug therapy: Secondary | ICD-10-CM | POA: Diagnosis not present

## 2015-08-07 DIAGNOSIS — C187 Malignant neoplasm of sigmoid colon: Secondary | ICD-10-CM

## 2015-08-07 DIAGNOSIS — I4891 Unspecified atrial fibrillation: Secondary | ICD-10-CM | POA: Diagnosis not present

## 2015-08-07 DIAGNOSIS — G4730 Sleep apnea, unspecified: Secondary | ICD-10-CM | POA: Diagnosis not present

## 2015-08-07 LAB — CBC WITH DIFFERENTIAL/PLATELET
BASOS ABS: 0 10*3/uL (ref 0–0.1)
BASOS PCT: 0 %
Eosinophils Absolute: 0.1 10*3/uL (ref 0–0.7)
Eosinophils Relative: 1 %
HEMATOCRIT: 44.3 % (ref 40.0–52.0)
HEMOGLOBIN: 15 g/dL (ref 13.0–18.0)
Lymphocytes Relative: 26 %
Lymphs Abs: 1.7 10*3/uL (ref 1.0–3.6)
MCH: 29.4 pg (ref 26.0–34.0)
MCHC: 33.8 g/dL (ref 32.0–36.0)
MCV: 87.1 fL (ref 80.0–100.0)
Monocytes Absolute: 0.5 10*3/uL (ref 0.2–1.0)
Monocytes Relative: 8 %
NEUTROS ABS: 4.2 10*3/uL (ref 1.4–6.5)
Neutrophils Relative %: 65 %
Platelets: 149 10*3/uL — ABNORMAL LOW (ref 150–440)
RBC: 5.09 MIL/uL (ref 4.40–5.90)
RDW: 13.8 % (ref 11.5–14.5)
WBC: 6.5 10*3/uL (ref 3.8–10.6)

## 2015-08-07 MED ORDER — GABAPENTIN 100 MG PO CAPS: 100.0000 mg | ORAL_CAPSULE | Freq: Two times a day (BID) | ORAL | Status: DC

## 2015-08-07 MED ORDER — TRAZODONE HCL 150 MG PO TABS: 150.0000 mg | ORAL_TABLET | Freq: Every evening | ORAL | Status: DC | PRN

## 2015-08-07 NOTE — Progress Notes (Signed)
Patient here for follow up. No concerns today BP elevated today. Patient will monitor at home and contact PCP if BP remains elevated.

## 2015-08-08 LAB — CEA: CEA: 0.6 ng/mL (ref 0.0–4.7)

## 2015-08-16 NOTE — Progress Notes (Signed)
[place]  Telephone:(336) [phone] Fax:(336) [phone]  ID: Paul Irwin OB: 04-Nov-1955  MR#: [id number]  [id number]  Patient Care Team: [name], MD as PCP - General (Internal Medicine) Robert Bellow, MD (General Surgery) Self Requested  CHIEF COMPLAINT:  Chief Complaint  Patient presents with  . Colon Cancer  . Follow-up    INTERVAL HISTORY: Patient returns to clinic today for repeat laboratory work and routine evaluation. He continues to have peripheral neuropathy in his fingers and toes, but it does not affect her day-to-day life. He does not complain of weakness and fatigue today. He has no other neurologic complaints.  He denies any recent fevers.  He denies any chest pain or shortness of breath.  He denies any nausea, vomiting, constipation, or diarrhea. He has no melena or hematochezia.  Patient offers no further specific complaints today.  REVIEW OF SYSTEMS:   Review of Systems  Constitutional: Negative.  Negative for fever, weight loss and malaise/fatigue.  Respiratory: Negative.  Negative for cough and shortness of breath.   Cardiovascular: Negative.  Negative for chest pain.  Gastrointestinal: Negative.  Negative for nausea, vomiting, abdominal pain, diarrhea, constipation, blood in stool and melena.  Genitourinary: Negative.   Musculoskeletal: Negative.   Neurological: Positive for sensory change. Negative for weakness.    As per HPI. Otherwise, a complete review of systems is negatve.  PAST MEDICAL HISTORY: Past Medical History  Diagnosis Date  . Sleep apnea   . Malignant neoplasm of sigmoid colon     T3,N1a. Moderately well differentiated.  1 / 13 nodes positive; adjuvant chemotherapy.   . Atrial fibrillation   . GERD (gastroesophageal reflux disease)   . Kidney stones     PAST SURGICAL HISTORY: Past Surgical History  Procedure Laterality Date  . Colonoscopy  06/10/13    Dr. Vira Agar  . Wrist surgery Right 2001    . Sigmoidoscopy  06/20/13  . Colon surgery    . Colonoscopy N/A 09/09/2014    Procedure: COLONOSCOPY;  Surgeon: Robert Bellow, MD;  Location: [facility] ENDOSCOPY;  Service: Endoscopy;  Laterality: N/A;    FAMILY HISTORY Family History  Problem Relation Age of Onset  . Heart disease Mother   . Heart disease Father        ADVANCED DIRECTIVES:    HEALTH MAINTENANCE: Social History  Substance Use Topics  . Smoking status: Never Smoker   . Smokeless tobacco: Never Used  . Alcohol Use: No     Colonoscopy:  PAP:  Bone density:  Lipid panel:  No Known Allergies  Current Outpatient Prescriptions  Medication Sig Dispense Refill  . gabapentin (NEURONTIN) 100 MG capsule Take 1 capsule (100 mg total) by mouth 2 (two) times daily. 180 capsule 1  . traZODone (DESYREL) 150 MG tablet Take 1 tablet (150 mg total) by mouth at bedtime as needed for sleep. 90 tablet 0   No current facility-administered medications for this visit.    OBJECTIVE: Filed Vitals:   08/07/15 1037  BP: 151/101  Pulse: 86  Temp: 97.3 F (36.3 C)     Body mass index is 38.06 kg/(m^2).    ECOG FS:0 - Asymptomatic  General: Well-developed, well-nourished, no acute distress. Eyes: anicteric sclera. Lungs: Clear to auscultation bilaterally. Heart: Regular rate and rhythm. No rubs, murmurs, or gallops. Abdomen: Soft, nontender, nondistended. No organomegaly noted, normoactive bowel sounds. Musculoskeletal: No edema, cyanosis, or clubbing. Neuro: Alert, answering all questions appropriately. Cranial nerves grossly intact. Skin: No rashes or petechiae  noted. Psych: Normal affect.   LAB RESULTS:  Lab Results  Component Value Date   NA 139 01/08/2014   K 3.5 01/08/2014   CL 103 01/08/2014   CO2 27 01/08/2014   GLUCOSE 123* 01/08/2014   BUN 12 01/08/2014   CREATININE 1.18 01/08/2014   CALCIUM 8.9 01/08/2014   PROT 6.5 01/08/2014   ALBUMIN 3.6 01/08/2014   AST 34 01/08/2014   ALT 41 01/08/2014    ALKPHOS 119* 01/08/2014   BILITOT 0.5 01/08/2014   GFRNONAA >60 01/08/2014   GFRAA >60 01/08/2014    Lab Results  Component Value Date   WBC 6.5 08/07/2015   NEUTROABS 4.2 08/07/2015   HGB 15.0 08/07/2015   HCT 44.3 08/07/2015   MCV 87.1 08/07/2015   PLT 149* 08/07/2015   Lab Results  Component Value Date   CEA 0.6 08/07/2015    STUDIES: No results found.  ASSESSMENT: Stage IIIb adenocarcinoma of the colon  PLAN:    1.  Colon cancer:  No evidence of disease. Patient completed 12 cycles of adjuvant FOLFOX chemotherapy on January 08, 2014. Patient had a colonoscopy on September 09, 2014 that was reported as normal, repeat in 3 years. CT scan in December 2015 did not reveal evidence of recurrence. No further imaging is necessary unless there is suspicion. Patient's CEA is also within normal limits. No intervention is needed at this time. Return to clinic in 6 months with repeat laboratory work and further evaluation.  2.  Diarrhea: Patient does not complain of this today.  Continue Imodium as needed. 3.  Weakness: Resolved. 4.  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 Continue gabapentin as prescribed.  Patient expressed understanding and was in agreement with this plan. He also understands that He can call clinic at any time with any questions, concerns, or complaints.    [name], MD   08/16/2015 8:38 AM

## 2015-09-07 ENCOUNTER — Telehealth: Payer: Self-pay | Admitting: *Deleted

## 2015-09-07 NOTE — Telephone Encounter (Signed)
Patient called and wanted to cancel recall appointment scheduled for 09/10/15 for Colon Ca follow up and CEA. He stated he is followed by Dr. Grayland Ormond and he gets labs done there and did not feel this appointment was necessary. He said if you can look up his CEA. He is in our [place] for 2019 for follow up colonoscopy.

## 2015-09-10 ENCOUNTER — Ambulatory Visit: Payer: BLUE CROSS/BLUE SHIELD | Admitting: General Surgery

## 2015-10-29 IMAGING — CT CT ABD-PELV W/ CM
2 of 5 series · 16 of 46 positions shown, 18 images · IV contrast (isovue)
Comparison: 06/11/2013.

CLINICAL DATA: Subsequent encounter for colon cancer restaging

EXAM:
CT ABDOMEN AND PELVIS WITH CONTRAST
TECHNIQUE: Multidetector CT imaging of the abdomen and pelvis was performed
using the standard protocol following bolus administration of
intravenous contrast.
CONTRAST:  125 cc Isovue 370

[Series 2: axial soft tissue · axial · 0.92mm/px · z∈[-786,-361]mm · 13 of 95 slices shown, 15 images]
[im 5/95  soft-tissue]
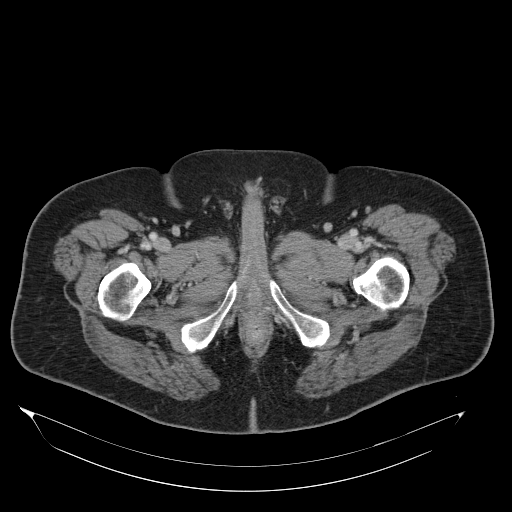
[im 5/95  bone]
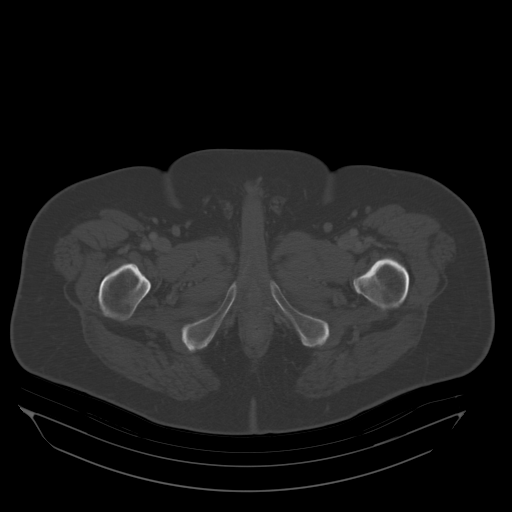
[im 15/95  soft-tissue]
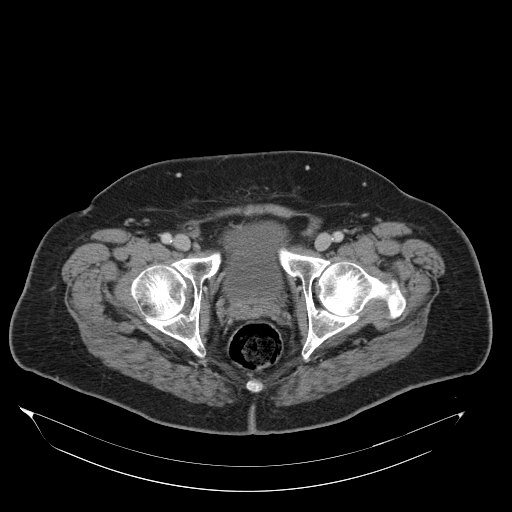
[im 19/95  soft-tissue]
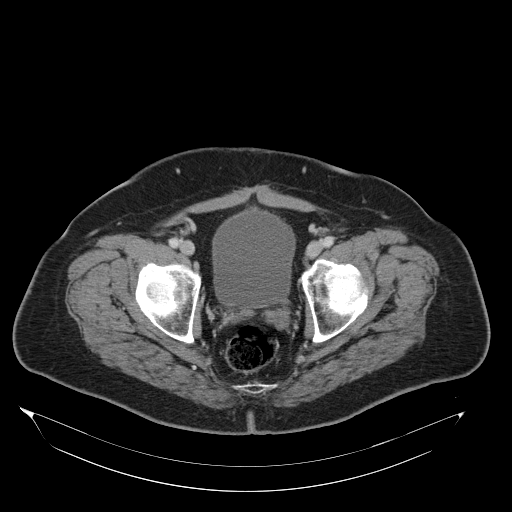
[im 29/95  soft-tissue]
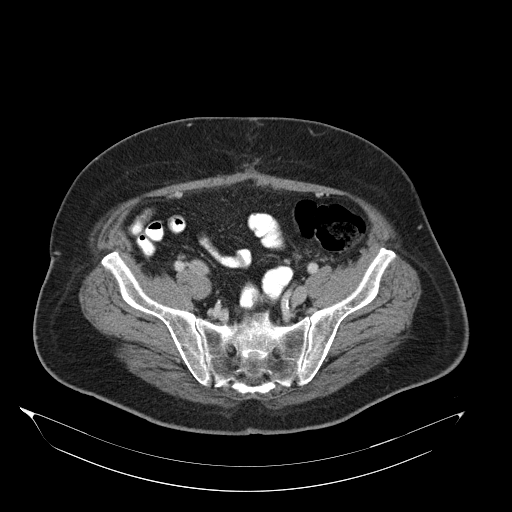
[im 33/95  soft-tissue]
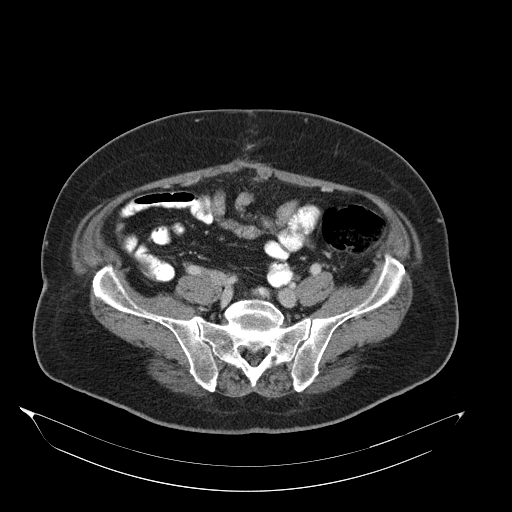
[im 43/95  soft-tissue]
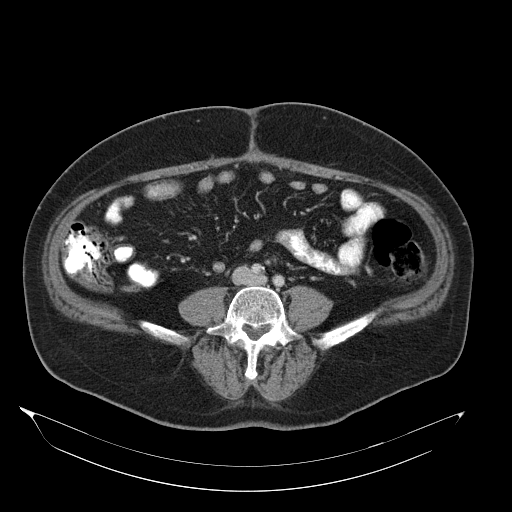
[im 48/95  soft-tissue]
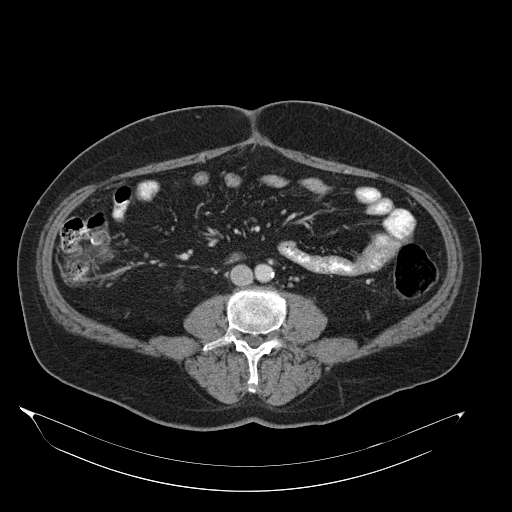
[im 52/95  soft-tissue]
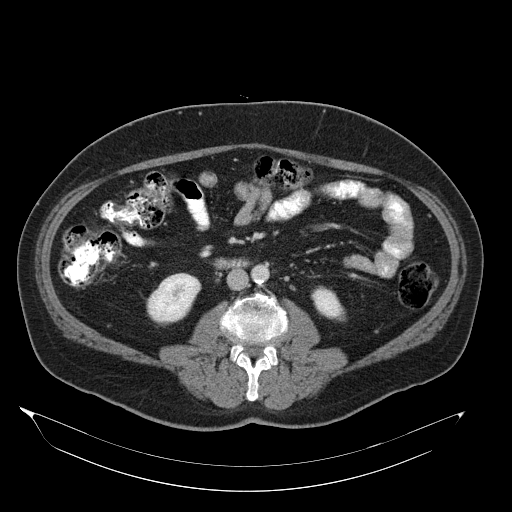
[im 62/95  soft-tissue]
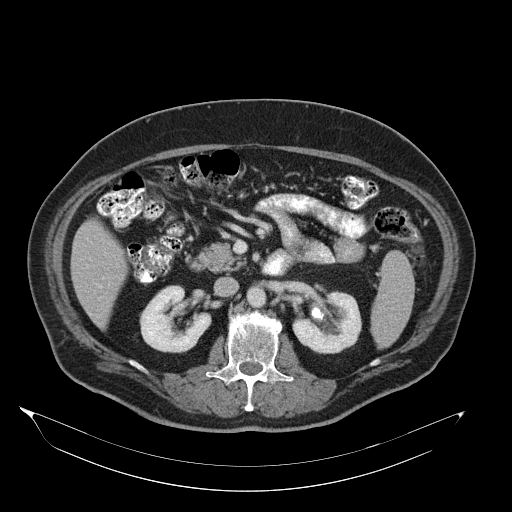
[im 62/95  bone]
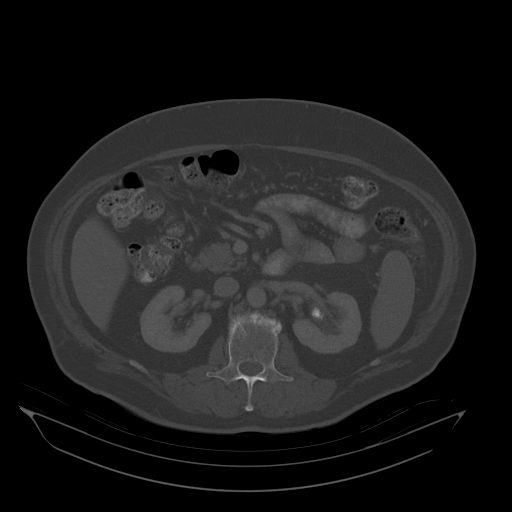
[im 66/95  soft-tissue]
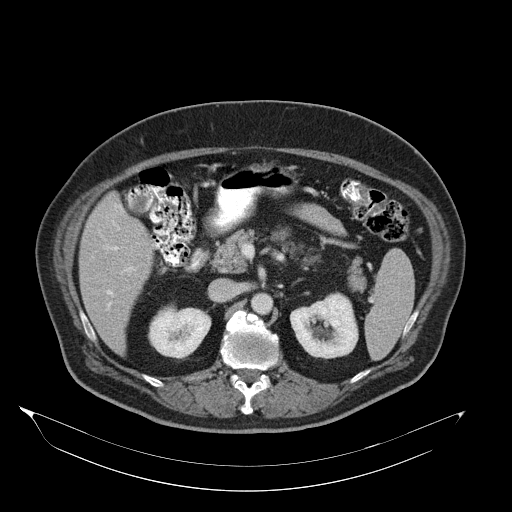
[im 76/95  soft-tissue]
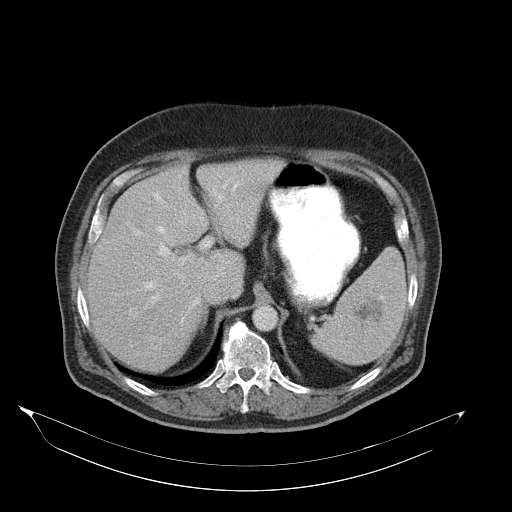
[im 80/95  soft-tissue]
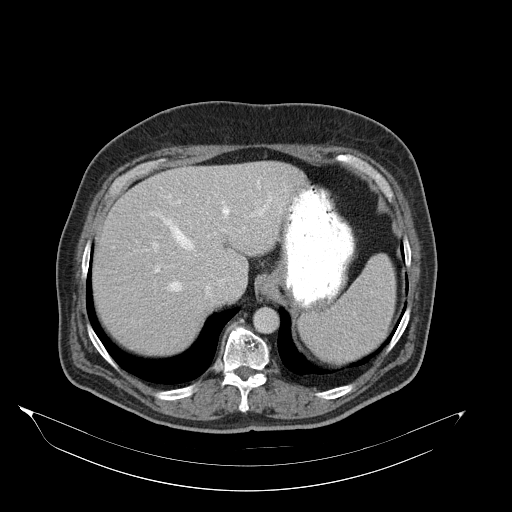
[im 90/95  soft-tissue]
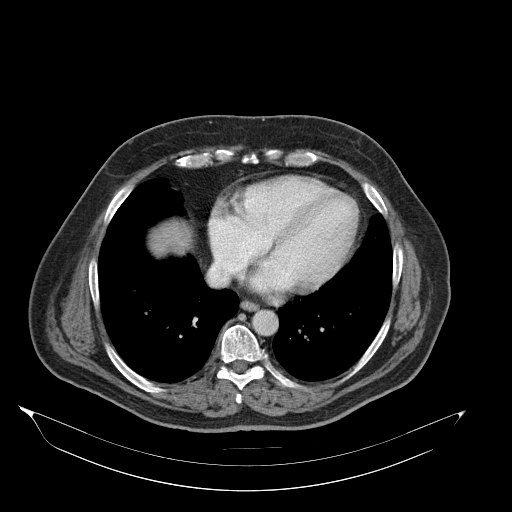

[Series 602: coronal · coronal · 0.94mm/px · 3 of 118 slices shown]
[im 40/118  soft-tissue]
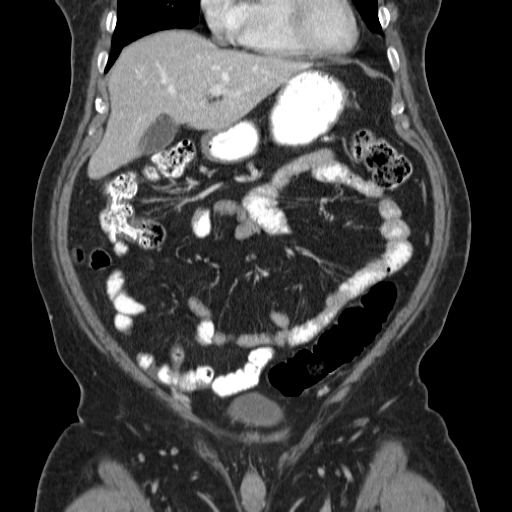
[im 53/118  soft-tissue]
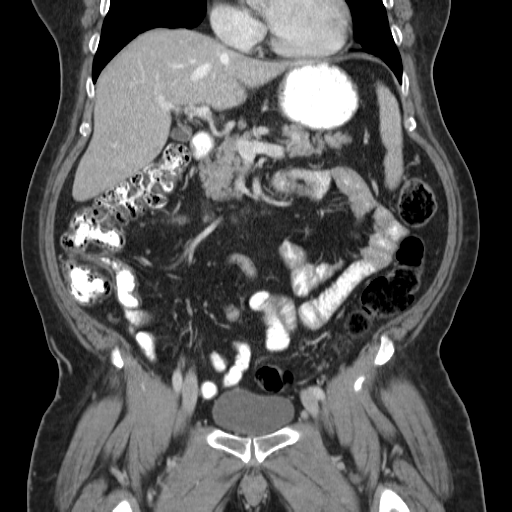
[im 66/118  soft-tissue]
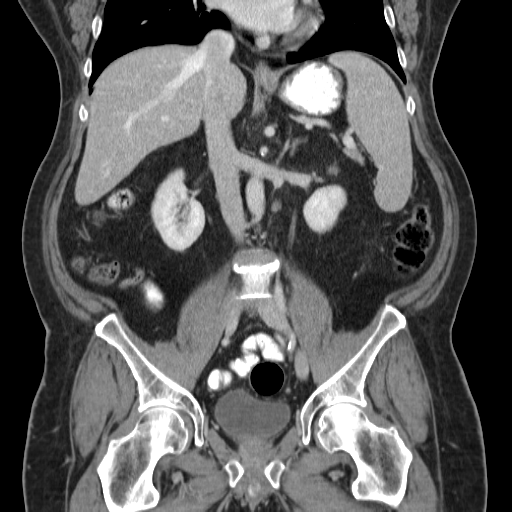

[16 of 46 positions shown; findings below may reference images not displayed]

FINDINGS: Lower chest:  Unremarkable.

Hepatobiliary: No focal abnormality within the liver parenchyma.
There is no evidence for gallstones, gallbladder wall thickening, or
pericholecystic fluid. No intrahepatic or extrahepatic biliary
dilation.

Pancreas: No focal mass lesion. No dilatation of the main duct. No
intraparenchymal cyst. No peripancreatic edema.

Spleen: 2.5 cm and ill-defined hypo attenuating lesion in the
central spleen is unchanged since the prior study and was present on
exam as far back as 04/11/2008, consistent with benign etiology.

Adrenals/Urinary Tract: No adrenal nodule or mass. Right kidney is
unremarkable. 15 x 8 x 24 mm nonobstructing stone is again
identified in the left renal pelvis. No evidence for ureteral or
bladder stones.

Stomach/Bowel: Stomach is nondistended. No gastric wall thickening.
No evidence of outlet obstruction. Duodenum is normally positioned
as is the ligament of Treitz. No small bowel wall thickening. No
small bowel dilatation. Terminal ileum is normal. Appendix is
normal. Colon is unremarkable except for anastomotic suture line in
the distal sigmoid segment. No evidence for stricture at the
anastomosis.

Vascular/Lymphatic: Atherosclerotic calcification is noted in the
wall of the abdominal aorta without aneurysm. No gastrohepatic or
hepatoduodenal ligament lymphadenopathy. No mesenteric or
retroperitoneal lymphadenopathy. No pelvic sidewall lymphadenopathy.

Reproductive: Prostate gland and seminal vesicles are unremarkable.

Other: No intraperitoneal free fluid.

Musculoskeletal: Bone windows reveal no worrisome lytic or sclerotic
osseous lesions.
IMPRESSION: 1. Status post left colonic resection. No evidence for colonic wall
thickening or pericolonic lymphadenopathy in the region of resection
to suggest local recurrence.
2. No evidence for distant metastatic disease in the abdomen or
pelvis.
3. Stable appearance of ill-defined central splenic lesion
compatible with a benign process.
4. Large nonobstructing stone again identified in the left renal
pelvis.

## 2016-02-10 NOTE — Progress Notes (Signed)
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  INTERVAL HISTORY: Patient returns to clinic today for repeat laboratory work and routine evaluation. He continues to have peripheral neuropathy in his fingers and toes and is worse during the winter months. He asks if he can have an increase dose of gabapentin for "cold days" when his neuropathy is worse. He does not complain of weakness and fatigue today. He has no other neurologic complaints.  He denies any recent fevers.  He denies any chest pain or shortness of breath.  He denies any nausea, vomiting, constipation, or diarrhea. He has no melena or hematochezia.  Patient offers no further specific complaints today.  REVIEW OF SYSTEMS:   Review of Systems  Constitutional: Negative.  Negative for fever, malaise/fatigue and weight loss.  Respiratory: Negative.  Negative for cough and shortness of breath.   Cardiovascular: Negative.  Negative for chest pain.  Gastrointestinal: Negative.  Negative for abdominal pain, blood in stool, constipation, diarrhea, melena, nausea and vomiting.  Genitourinary: Negative.   Musculoskeletal: Negative.   Neurological: Positive for sensory change. Negative for weakness.    As per HPI. Otherwise, a complete review of systems is negative.  PAST MEDICAL HISTORY: Past Medical History:  Diagnosis Date  . Atrial fibrillation   . GERD (gastroesophageal reflux disease)   . Kidney stones   . Malignant neoplasm of sigmoid colon    T3,N1a. Moderately well differentiated.  1 / 13 nodes positive; adjuvant chemotherapy.   . Sleep apnea     PAST SURGICAL HISTORY: Past Surgical History:  Procedure Laterality Date  .  COLON SURGERY    . colonoscopy  06/10/13   Dr. Vira Agar  . COLONOSCOPY N/A 09/09/2014   Procedure: COLONOSCOPY;  Surgeon: Robert Bellow, MD;  Location: [facility] ENDOSCOPY;  Service: Endoscopy;  Laterality: N/A;  . SIGMOIDOSCOPY  06/20/13  . WRIST SURGERY Right 2001    FAMILY HISTORY Family History  Problem Relation Age of Onset  . Heart disease Mother   . Heart disease Father        ADVANCED DIRECTIVES:    HEALTH MAINTENANCE: Social History  Substance Use Topics  . Smoking status: Never Smoker  . Smokeless tobacco: Never Used  . Alcohol use No     Colonoscopy:  PAP:  Bone density:  Lipid panel:  No Known Allergies  Current Outpatient Prescriptions  Medication Sig Dispense Refill  . gabapentin (NEURONTIN) 100 MG capsule Take 1 capsule (100 mg total) by mouth 2 (two) times daily. 180 capsule 1  . traZODone (DESYREL) 150 MG tablet Take 1 tablet (150 mg total) by mouth at bedtime as needed for sleep. 90 tablet 0   No current facility-administered medications for this visit.     OBJECTIVE: Vitals:   02/12/16 1028  BP: (!) 150/101  Pulse: 98  Resp: 18  Temp: 97.5 F (36.4 C)     Body mass index is 38.59 kg/m.    ECOG FS:0 - Asymptomatic  General: Well-developed, well-nourished, no acute distress. Eyes: anicteric sclera. Lungs: Clear to auscultation bilaterally. Heart: Regular rate and rhythm. No rubs, murmurs, or gallops. Abdomen: Soft, nontender, nondistended. No organomegaly noted, normoactive bowel sounds. Musculoskeletal: No edema, cyanosis, or clubbing. Neuro: Alert, answering all questions appropriately. Cranial  nerves grossly intact. Skin: No rashes or petechiae noted. Psych: Normal affect.   LAB RESULTS:  Lab Results  Component Value Date   NA 139 01/08/2014   K 3.5 01/08/2014   CL 103 01/08/2014   CO2 27 01/08/2014   GLUCOSE 123 (H) 01/08/2014   BUN 12 01/08/2014   CREATININE 1.18 01/08/2014   CALCIUM 8.9 01/08/2014   PROT 6.5 01/08/2014    ALBUMIN 3.6 01/08/2014   AST 34 01/08/2014   ALT 41 01/08/2014   ALKPHOS 119 (H) 01/08/2014   BILITOT 0.5 01/08/2014   GFRNONAA >60 01/08/2014   GFRAA >60 01/08/2014    Lab Results  Component Value Date   WBC 6.5 02/12/2016   NEUTROABS 4.1 02/12/2016   HGB 14.9 02/12/2016   HCT 45.3 02/12/2016   MCV 87.1 02/12/2016   PLT 147 (L) 02/12/2016   Lab Results  Component Value Date   CEA 0.7 02/12/2016    STUDIES: No results found.  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  PLAN:    1. 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 Patient completed 12 cycles of adjuvant FOLFOX chemotherapy on January 08, 2014. Patient had a colonoscopy on September 09, 2014 that was reported as normal, repeat in 3 years. CT scan in December 2015 did not reveal evidence of recurrence. No further imaging is necessary unless there is suspicion. Patient's CEA is pending today but has been within normal limits. No intervention is needed at this time. Return to clinic in 6 months with repeat laboratory work and further evaluation.  2.  Diarrhea: Resolved. 3.  Weakness: Resolved. 4.  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 Continue gabapentin as prescribed. Encouraged patient to take twice daily when neuropathy is worse. He currently is only taking one at bedtime. 5. Hypertension: Patient's blood pressure is elevated today. Continue monitoring treatment per primary care.  Patient expressed understanding and was in agreement with this plan. He also understands that He can call clinic at any time with any questions, concerns, or complaints.   Faythe Casa, NP 02/12/2016 1:20 PM  Patient was seen and evaluated independently and I agree with the assessment and plan as dictated above. CEA has resulted and continues to be within normal limits.  [name], MD 02/14/16 10:22 AM

## 2016-02-12 ENCOUNTER — Inpatient Hospital Stay: Payer: BLUE CROSS/BLUE SHIELD | Attending: Oncology

## 2016-02-12 ENCOUNTER — Inpatient Hospital Stay (HOSPITAL_BASED_OUTPATIENT_CLINIC_OR_DEPARTMENT_OTHER): Payer: BLUE CROSS/BLUE SHIELD | Admitting: Oncology

## 2016-02-12 VITALS — BP 150/101 | HR 98 | Temp 97.5°F | Resp 18 | Wt 239.1 lb

## 2016-02-12 DIAGNOSIS — Z87442 Personal history of urinary calculi: Secondary | ICD-10-CM | POA: Diagnosis not present

## 2016-02-12 DIAGNOSIS — I10 Essential (primary) hypertension: Secondary | ICD-10-CM

## 2016-02-12 DIAGNOSIS — C187 Malignant neoplasm of sigmoid colon: Secondary | ICD-10-CM

## 2016-02-12 DIAGNOSIS — Z79899 Other long term (current) drug therapy: Secondary | ICD-10-CM

## 2016-02-12 DIAGNOSIS — G4730 Sleep apnea, unspecified: Secondary | ICD-10-CM

## 2016-02-12 DIAGNOSIS — G629 Polyneuropathy, unspecified: Secondary | ICD-10-CM | POA: Diagnosis not present

## 2016-02-12 DIAGNOSIS — K219 Gastro-esophageal reflux disease without esophagitis: Secondary | ICD-10-CM | POA: Insufficient documentation

## 2016-02-12 DIAGNOSIS — I4891 Unspecified atrial fibrillation: Secondary | ICD-10-CM

## 2016-02-12 DIAGNOSIS — Z85038 Personal history of other malignant neoplasm of large intestine: Secondary | ICD-10-CM | POA: Insufficient documentation

## 2016-02-12 LAB — CBC WITH DIFFERENTIAL/PLATELET
BASOS PCT: 1 %
Basophils Absolute: 0 10*3/uL (ref 0–0.1)
EOS ABS: 0.1 10*3/uL (ref 0–0.7)
Eosinophils Relative: 2 %
HEMATOCRIT: 45.3 % (ref 40.0–52.0)
HEMOGLOBIN: 14.9 g/dL (ref 13.0–18.0)
Lymphocytes Relative: 25 %
Lymphs Abs: 1.6 10*3/uL (ref 1.0–3.6)
MCH: 28.7 pg (ref 26.0–34.0)
MCHC: 33 g/dL (ref 32.0–36.0)
MCV: 87.1 fL (ref 80.0–100.0)
Monocytes Absolute: 0.6 10*3/uL (ref 0.2–1.0)
Monocytes Relative: 9 %
NEUTROS PCT: 63 %
Neutro Abs: 4.1 10*3/uL (ref 1.4–6.5)
Platelets: 147 10*3/uL — ABNORMAL LOW (ref 150–440)
RBC: 5.19 MIL/uL (ref 4.40–5.90)
RDW: 14.1 % (ref 11.5–14.5)
WBC: 6.5 10*3/uL (ref 3.8–10.6)

## 2016-02-12 NOTE — Progress Notes (Signed)
Offers no complaints. States is feeling well. 

## 2016-02-13 LAB — CEA: CEA: 0.7 ng/mL (ref 0.0–4.7)

## 2016-06-01 ENCOUNTER — Other Ambulatory Visit: Payer: Self-pay | Admitting: *Deleted

## 2016-06-01 MED ORDER — GABAPENTIN 100 MG PO CAPS: 100.0000 mg | ORAL_CAPSULE | Freq: Two times a day (BID) | ORAL | 1 refills | Status: DC

## 2016-08-18 NOTE — Progress Notes (Signed)
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  INTERVAL HISTORY: Patient returns to clinic today for repeat laboratory work and routine 6 month evaluation. He states his peripheral neuropathy only bothers him during the cold months. He is not currently taking gabapentin. He currently feels well and is asymptomatic. He denies any weakness or fatigue. He has no other neurologic complaints.  He denies any recent fevers.  He denies any chest pain or shortness of breath.  He denies any nausea, vomiting, constipation, or diarrhea. He has no melena or hematochezia.  Patient offers no further specific complaints today.  REVIEW OF SYSTEMS:   Review of Systems  Constitutional: Negative.  Negative for fever, malaise/fatigue and weight loss.  Respiratory: Negative.  Negative for cough and shortness of breath.   Cardiovascular: Negative.  Negative for chest pain and leg swelling.  Gastrointestinal: Negative.  Negative for abdominal pain, blood in stool, constipation, diarrhea, melena, nausea and vomiting.  Genitourinary: Negative.   Musculoskeletal: Negative.   Skin: Negative.  Negative for rash.  Neurological: Negative for sensory change and weakness.  Psychiatric/Behavioral: Negative.  The patient is not nervous/anxious.     As per HPI. Otherwise, a complete review of systems is negative.  PAST MEDICAL HISTORY: Past Medical History:  Diagnosis Date  . Atrial fibrillation   . GERD (gastroesophageal reflux disease)   . Kidney stones   . Malignant neoplasm of sigmoid colon    T3,N1a. Moderately well differentiated.  1 / 13 nodes positive; adjuvant chemotherapy.   . Sleep apnea     PAST  SURGICAL HISTORY: Past Surgical History:  Procedure Laterality Date  . COLON SURGERY    . colonoscopy  06/10/13   Dr. Vira Agar  . COLONOSCOPY N/A 09/09/2014   Procedure: COLONOSCOPY;  Surgeon: Robert Bellow, MD;  Location: [facility] ENDOSCOPY;  Service: Endoscopy;  Laterality: N/A;  . SIGMOIDOSCOPY  06/20/13  . WRIST SURGERY Right 2001    FAMILY HISTORY Family History  Problem Relation Age of Onset  . Heart disease Mother   . Heart disease Father        ADVANCED DIRECTIVES:    HEALTH MAINTENANCE: Social History  Substance Use Topics  . Smoking status: Never Smoker  . Smokeless tobacco: Never Used  . Alcohol use No     Colonoscopy:  PAP:  Bone density:  Lipid panel:  No Known Allergies  Current Outpatient Prescriptions  Medication Sig Dispense Refill  . gabapentin (NEURONTIN) 100 MG capsule Take 1 capsule (100 mg total) by mouth 2 (two) times daily. 180 capsule 1  . traZODone (DESYREL) 150 MG tablet Take 1 tablet (150 mg total) by mouth at bedtime as needed for sleep. 90 tablet 0   No current facility-administered medications for this visit.     OBJECTIVE: Vitals:   08/19/16 1135  BP: (!) 142/83  Pulse: 76  Resp: 18  Temp: 98.1 F (36.7 C)     Body mass index is 37.66 kg/m.    ECOG FS:0 - Asymptomatic  General: Well-developed, well-nourished, no acute distress. Eyes: anicteric sclera. Lungs: Clear to auscultation bilaterally. Heart: Regular rate and rhythm. No rubs, murmurs, or gallops. Abdomen: Soft, nontender, nondistended. No organomegaly noted, normoactive bowel sounds. Musculoskeletal: No edema, cyanosis, or clubbing. Neuro:  Alert, answering all questions appropriately. Cranial nerves grossly intact. Skin: No rashes or petechiae noted. Psych: Normal affect.   LAB RESULTS:  Lab Results  Component Value Date   NA 139 01/08/2014   K 3.5 01/08/2014   CL 103 01/08/2014   CO2 27 01/08/2014   GLUCOSE 123 (H) 01/08/2014   BUN 12 01/08/2014    CREATININE 1.18 01/08/2014   CALCIUM 8.9 01/08/2014   PROT 6.5 01/08/2014   ALBUMIN 3.6 01/08/2014   AST 34 01/08/2014   ALT 41 01/08/2014   ALKPHOS 119 (H) 01/08/2014   BILITOT 0.5 01/08/2014   GFRNONAA >60 01/08/2014   GFRAA >60 01/08/2014    Lab Results  Component Value Date   WBC 5.9 08/19/2016   NEUTROABS 3.5 08/19/2016   HGB 14.9 08/19/2016   HCT 44.1 08/19/2016   MCV 86.7 08/19/2016   PLT 163 08/19/2016   Lab Results  Component Value Date   CEA 0.7 02/12/2016    STUDIES: No results found.  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  PLAN:    1. 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 Patient completed 12 cycles of adjuvant FOLFOX chemotherapy on January 08, 2014. Patient had a colonoscopy on September 09, 2014 that was reported as normal, repeat in July 2019. CT scan in December 2015 did not reveal evidence of recurrence. No further imaging is necessary unless there is suspicion. Patient's CEA continues to be within normal limits, today's result is pending. No intervention is needed at this time. Return to clinic in 6 months with repeat laboratory work and further evaluation. Patient can likely be switched to laboratory work every 6 months and evaluation once a year at this point. 2.  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 Continue gabapentin as needed during the winter months. 3. Hypertension: Patient's blood pressure is elevated today. Continue monitoring treatment per primary care.  Patient expressed understanding and was in agreement with this plan. He also understands that He can call clinic at any time with any questions, concerns, or complaints.    [name], MD 08/19/16 11:39 AM

## 2016-08-19 ENCOUNTER — Inpatient Hospital Stay: Payer: BLUE CROSS/BLUE SHIELD | Attending: Oncology

## 2016-08-19 ENCOUNTER — Inpatient Hospital Stay (HOSPITAL_BASED_OUTPATIENT_CLINIC_OR_DEPARTMENT_OTHER): Payer: BLUE CROSS/BLUE SHIELD | Admitting: Oncology

## 2016-08-19 VITALS — BP 142/83 | HR 76 | Temp 98.1°F | Resp 18 | Wt 233.4 lb

## 2016-08-19 DIAGNOSIS — Z85038 Personal history of other malignant neoplasm of large intestine: Secondary | ICD-10-CM

## 2016-08-19 DIAGNOSIS — I4891 Unspecified atrial fibrillation: Secondary | ICD-10-CM

## 2016-08-19 DIAGNOSIS — K219 Gastro-esophageal reflux disease without esophagitis: Secondary | ICD-10-CM | POA: Diagnosis not present

## 2016-08-19 DIAGNOSIS — Z9221 Personal history of antineoplastic chemotherapy: Secondary | ICD-10-CM | POA: Diagnosis not present

## 2016-08-19 DIAGNOSIS — G4730 Sleep apnea, unspecified: Secondary | ICD-10-CM | POA: Diagnosis not present

## 2016-08-19 DIAGNOSIS — I10 Essential (primary) hypertension: Secondary | ICD-10-CM

## 2016-08-19 DIAGNOSIS — Z87442 Personal history of urinary calculi: Secondary | ICD-10-CM | POA: Diagnosis not present

## 2016-08-19 DIAGNOSIS — G629 Polyneuropathy, unspecified: Secondary | ICD-10-CM | POA: Insufficient documentation

## 2016-08-19 DIAGNOSIS — Z79899 Other long term (current) drug therapy: Secondary | ICD-10-CM | POA: Insufficient documentation

## 2016-08-19 DIAGNOSIS — C187 Malignant neoplasm of sigmoid colon: Secondary | ICD-10-CM

## 2016-08-19 LAB — CBC WITH DIFFERENTIAL/PLATELET
BASOS ABS: 0 10*3/uL (ref 0–0.1)
Basophils Relative: 1 %
Eosinophils Absolute: 0.1 10*3/uL (ref 0–0.7)
Eosinophils Relative: 1 %
HEMATOCRIT: 44.1 % (ref 40.0–52.0)
Hemoglobin: 14.9 g/dL (ref 13.0–18.0)
Lymphocytes Relative: 31 %
Lymphs Abs: 1.8 10*3/uL (ref 1.0–3.6)
MCH: 29.2 pg (ref 26.0–34.0)
MCHC: 33.7 g/dL (ref 32.0–36.0)
MCV: 86.7 fL (ref 80.0–100.0)
MONO ABS: 0.5 10*3/uL (ref 0.2–1.0)
Monocytes Relative: 8 %
NEUTROS ABS: 3.5 10*3/uL (ref 1.4–6.5)
Neutrophils Relative %: 59 %
Platelets: 163 10*3/uL (ref 150–440)
RBC: 5.09 MIL/uL (ref 4.40–5.90)
RDW: 13.7 % (ref 11.5–14.5)
WBC: 5.9 10*3/uL (ref 3.8–10.6)

## 2016-08-19 NOTE — Progress Notes (Signed)
Patient here today for follow up.  Patient states no new concerns today  

## 2016-08-20 LAB — CEA: CEA: 0.8 ng/mL (ref 0.0–4.7)

## 2017-02-17 ENCOUNTER — Other Ambulatory Visit: Payer: Self-pay

## 2017-02-17 ENCOUNTER — Inpatient Hospital Stay: Payer: BLUE CROSS/BLUE SHIELD | Attending: Oncology | Admitting: Oncology

## 2017-02-17 ENCOUNTER — Inpatient Hospital Stay: Payer: BLUE CROSS/BLUE SHIELD

## 2017-02-17 VITALS — BP 161/107 | HR 74 | Temp 97.1°F | Resp 20 | Wt 240.1 lb

## 2017-02-17 DIAGNOSIS — R5383 Other fatigue: Secondary | ICD-10-CM | POA: Diagnosis not present

## 2017-02-17 DIAGNOSIS — C187 Malignant neoplasm of sigmoid colon: Secondary | ICD-10-CM | POA: Insufficient documentation

## 2017-02-17 DIAGNOSIS — F419 Anxiety disorder, unspecified: Secondary | ICD-10-CM | POA: Diagnosis not present

## 2017-02-17 DIAGNOSIS — R531 Weakness: Secondary | ICD-10-CM | POA: Insufficient documentation

## 2017-02-17 DIAGNOSIS — I10 Essential (primary) hypertension: Secondary | ICD-10-CM | POA: Insufficient documentation

## 2017-02-17 DIAGNOSIS — K219 Gastro-esophageal reflux disease without esophagitis: Secondary | ICD-10-CM | POA: Insufficient documentation

## 2017-02-17 DIAGNOSIS — G629 Polyneuropathy, unspecified: Secondary | ICD-10-CM | POA: Diagnosis not present

## 2017-02-17 DIAGNOSIS — I4891 Unspecified atrial fibrillation: Secondary | ICD-10-CM | POA: Insufficient documentation

## 2017-02-17 DIAGNOSIS — G4730 Sleep apnea, unspecified: Secondary | ICD-10-CM | POA: Insufficient documentation

## 2017-02-17 DIAGNOSIS — Z79899 Other long term (current) drug therapy: Secondary | ICD-10-CM | POA: Insufficient documentation

## 2017-02-17 DIAGNOSIS — Z87442 Personal history of urinary calculi: Secondary | ICD-10-CM | POA: Diagnosis not present

## 2017-02-17 LAB — CBC WITH DIFFERENTIAL/PLATELET
BASOS ABS: 0 10*3/uL (ref 0–0.1)
BASOS PCT: 1 %
EOS ABS: 0.1 10*3/uL (ref 0–0.7)
EOS PCT: 1 %
HCT: 43.5 % (ref 40.0–52.0)
Hemoglobin: 14.8 g/dL (ref 13.0–18.0)
Lymphocytes Relative: 32 %
Lymphs Abs: 1.7 10*3/uL (ref 1.0–3.6)
MCH: 29.5 pg (ref 26.0–34.0)
MCHC: 34 g/dL (ref 32.0–36.0)
MCV: 86.8 fL (ref 80.0–100.0)
Monocytes Absolute: 0.4 10*3/uL (ref 0.2–1.0)
Monocytes Relative: 8 %
NEUTROS PCT: 58 %
Neutro Abs: 3 10*3/uL (ref 1.4–6.5)
PLATELETS: 153 10*3/uL (ref 150–440)
RBC: 5.01 MIL/uL (ref 4.40–5.90)
RDW: 14 % (ref 11.5–14.5)
WBC: 5.2 10*3/uL (ref 3.8–10.6)

## 2017-02-17 NOTE — Progress Notes (Signed)
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  INTERVAL HISTORY: Patient returns to clinic today for repeat laboratory work and routine 6 month evaluation. He states his peripheral neuropathy only bothers him during the cold months. He is is taking gabapentin as PRN.  He does not take it routinely because he does not like the way it makes him feel. He currently feels well and is asymptomatic. 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 He has no other neurologic complaints. He does have anxiety when he has an appointment and labs drawn.  He denies any recent fevers.  He denies any chest pain or shortness of breath.  He denies any nausea, vomiting, constipation, or diarrhea. He has no melena or hematochezia.   REVIEW OF SYSTEMS:   Review of Systems  Constitutional: Negative.  Negative for fever, malaise/fatigue and weight loss.  Respiratory: Negative.  Negative for cough and shortness of breath.   Cardiovascular: Negative.  Negative for chest pain and leg swelling.  Gastrointestinal: Negative.  Negative for abdominal pain, blood in stool, constipation, diarrhea, melena, nausea and vomiting.  Genitourinary: Negative.   Musculoskeletal: Negative.   Skin: Negative.  Negative for rash.  Neurological: Negative for sensory change and weakness.  Psychiatric/Behavioral: Negative.  The patient is not nervous/anxious.     As per HPI. Otherwise, a complete review of systems is negative.  PAST MEDICAL HISTORY: Past Medical History:  Diagnosis Date  . Atrial fibrillation   . GERD (gastroesophageal reflux disease)   . Kidney stones   .  Malignant neoplasm of sigmoid colon    T3,N1a. Moderately well differentiated.  1 / 13 nodes positive; adjuvant chemotherapy.   . Sleep apnea     PAST SURGICAL HISTORY: Past Surgical History:  Procedure Laterality Date  . COLON SURGERY    . colonoscopy  06/10/13   Dr. Vira Agar  . COLONOSCOPY N/A 09/09/2014   Procedure: COLONOSCOPY;  Surgeon: Robert Bellow, MD;  Location: [facility] ENDOSCOPY;  Service: Endoscopy;  Laterality: N/A;  . SIGMOIDOSCOPY  06/20/13  . WRIST SURGERY Right 2001    FAMILY HISTORY Family History  Problem Relation Age of Onset  . Heart disease Mother   . Heart disease Father        ADVANCED DIRECTIVES:    HEALTH MAINTENANCE: Social History   Tobacco Use  . Smoking status: Never Smoker  . Smokeless tobacco: Never Used  Substance Use Topics  . Alcohol use: No  . Drug use: No     Colonoscopy: Due in July 2019.  PAP:  Bone density:  Lipid panel:  No Known Allergies  Current Outpatient Medications  Medication Sig Dispense Refill  . gabapentin (NEURONTIN) 100 MG capsule Take 1 capsule (100 mg total) by mouth 2 (two) times daily. 180 capsule 1  . traZODone (DESYREL) 150 MG tablet Take 1 tablet (150 mg total) by mouth at bedtime as needed for sleep. 90 tablet 0   No current facility-administered medications for this visit.     OBJECTIVE: Vitals:   02/17/17 0946  BP: (!) 161/107  Pulse: 74  Resp: 20  Temp: (!) 97.1 F (36.2 C)     Body mass index is 38.75  kg/m.    ECOG FS:0 - Asymptomatic  General: Well-developed, well-nourished, no acute distress. Eyes: anicteric sclera. Lungs: Clear to auscultation bilaterally. Heart: Regular rate and rhythm. No rubs, murmurs, or gallops. Abdomen: Soft, nontender, nondistended. No organomegaly noted, normoactive bowel sounds. Musculoskeletal: No edema, cyanosis, or clubbing. Neuro: Alert, answering all questions appropriately. Cranial nerves grossly intact. Skin: No rashes or petechiae noted. Psych:  Normal affect.   LAB RESULTS:  Lab Results  Component Value Date   NA 139 01/08/2014   K 3.5 01/08/2014   CL 103 01/08/2014   CO2 27 01/08/2014   GLUCOSE 123 (H) 01/08/2014   BUN 12 01/08/2014   CREATININE 1.18 01/08/2014   CALCIUM 8.9 01/08/2014   PROT 6.5 01/08/2014   ALBUMIN 3.6 01/08/2014   AST 34 01/08/2014   ALT 41 01/08/2014   ALKPHOS 119 (H) 01/08/2014   BILITOT 0.5 01/08/2014   GFRNONAA >60 01/08/2014   GFRAA >60 01/08/2014    Lab Results  Component Value Date   WBC 5.2 02/17/2017   NEUTROABS 3.0 02/17/2017   HGB 14.8 02/17/2017   HCT 43.5 02/17/2017   MCV 86.8 02/17/2017   PLT 153 02/17/2017   Lab Results  Component Value Date   CEA 0.8 08/19/2016    STUDIES: No results found.  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  PLAN:    1. 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 Patient completed 12 cycles of adjuvant FOLFOX chemotherapy on January 08, 2014. Patient had a colonoscopy on September 09, 2014 that was reported as normal, repeat in July 2019. Will get this scheduled prior to next visit. Patient instructed to call Dr. Fleet Contras to have colonoscopy scheduled. CT scan in December 2015 did not reveal evidence of recurrence. No further imaging is necessary unless there is suspicion. Patient's CEA continues to be within normal limits, today's result is pending. No intervention is needed at this time. Return to clinic in 6 months after colonoscopy with repeat laboratory work and further evaluation. 2.  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 Continue gabapentin as needed during the winter months. 3. Hypertension: Patient's blood pressure is elevated today. Continue monitoring treatment per primary care.  Patient expressed understanding and was in agreement with this plan. He also understands that He can call clinic at any time with any questions, concerns, or complaints.    Jacquelin Hawking, NP  02/17/17 11:54 AM

## 2017-02-17 NOTE — Progress Notes (Signed)
Patient denies any concerns today.  

## 2017-02-18 LAB — CEA: CEA1: 0.7 ng/mL (ref 0.0–4.7)

## 2017-08-10 ENCOUNTER — Other Ambulatory Visit: Payer: Self-pay | Admitting: *Deleted

## 2017-08-10 DIAGNOSIS — C189 Malignant neoplasm of colon, unspecified: Secondary | ICD-10-CM

## 2017-08-18 ENCOUNTER — Other Ambulatory Visit: Payer: BLUE CROSS/BLUE SHIELD

## 2017-08-18 ENCOUNTER — Ambulatory Visit: Payer: BLUE CROSS/BLUE SHIELD | Admitting: Nurse Practitioner

## 2017-09-03 NOTE — Progress Notes (Deleted)
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  INTERVAL HISTORY: Patient returns to clinic today for repeat laboratory work and routine 6 month evaluation. He states his peripheral neuropathy only bothers him during the cold months. He is not currently taking gabapentin. He currently feels well and is asymptomatic. He denies any weakness or fatigue. He has no other neurologic complaints.  He denies any recent fevers.  He denies any chest pain or shortness of breath.  He denies any nausea, vomiting, constipation, or diarrhea. He has no melena or hematochezia.  Patient offers no further specific complaints today.  REVIEW OF SYSTEMS:   Review of Systems  Constitutional: Negative.  Negative for fever, malaise/fatigue and weight loss.  Respiratory: Negative.  Negative for cough and shortness of breath.   Cardiovascular: Negative.  Negative for chest pain and leg swelling.  Gastrointestinal: Negative.  Negative for abdominal pain, blood in stool, constipation, diarrhea, melena, nausea and vomiting.  Genitourinary: Negative.   Musculoskeletal: Negative.   Skin: Negative.  Negative for rash.  Neurological: Negative for sensory change and weakness.  Psychiatric/Behavioral: Negative.  The patient is not nervous/anxious.     As per HPI. Otherwise, a complete review of systems is negative.  PAST MEDICAL HISTORY: Past Medical History:  Diagnosis Date  . Atrial fibrillation   . GERD (gastroesophageal reflux disease)   . Kidney stones   . Malignant neoplasm of sigmoid colon    T3,N1a. Moderately well differentiated.  1 / 13 nodes positive; adjuvant chemotherapy.   . Sleep apnea     PAST  SURGICAL HISTORY: Past Surgical History:  Procedure Laterality Date  . COLON SURGERY    . colonoscopy  06/10/13   Dr. Vira Agar  . COLONOSCOPY N/A 09/09/2014   Procedure: COLONOSCOPY;  Surgeon: Robert Bellow, MD;  Location: [facility] ENDOSCOPY;  Service: Endoscopy;  Laterality: N/A;  . SIGMOIDOSCOPY  06/20/13  . WRIST SURGERY Right 2001    FAMILY HISTORY Family History  Problem Relation Age of Onset  . Heart disease Mother   . Heart disease Father        ADVANCED DIRECTIVES:    HEALTH MAINTENANCE: Social History   Tobacco Use  . Smoking status: Never Smoker  . Smokeless tobacco: Never Used  Substance Use Topics  . Alcohol use: No  . Drug use: No     Colonoscopy:  PAP:  Bone density:  Lipid panel:  No Known Allergies  Current Outpatient Medications  Medication Sig Dispense Refill  . gabapentin (NEURONTIN) 100 MG capsule Take 1 capsule (100 mg total) by mouth 2 (two) times daily. 180 capsule 1  . traZODone (DESYREL) 150 MG tablet Take 1 tablet (150 mg total) by mouth at bedtime as needed for sleep. 90 tablet 0   No current facility-administered medications for this visit.     OBJECTIVE: There were no vitals filed for this visit.   There is no height or weight on file to calculate BMI.    ECOG FS:0 - Asymptomatic  General: Well-developed, well-nourished, no acute distress. Eyes: anicteric sclera. Lungs: Clear to auscultation bilaterally. Heart: Regular rate and rhythm. No rubs, murmurs, or gallops. Abdomen: Soft, nontender, nondistended. No organomegaly noted, normoactive bowel sounds. Musculoskeletal: No edema, cyanosis, or clubbing. Neuro: Alert,  answering all questions appropriately. Cranial nerves grossly intact. Skin: No rashes or petechiae noted. Psych: Normal affect.   LAB RESULTS:  Lab Results  Component Value Date   NA 139 01/08/2014   K 3.5 01/08/2014   CL 103 01/08/2014   CO2 27 01/08/2014   GLUCOSE 123 (H) 01/08/2014   BUN 12 01/08/2014    CREATININE 1.18 01/08/2014   CALCIUM 8.9 01/08/2014   PROT 6.5 01/08/2014   ALBUMIN 3.6 01/08/2014   AST 34 01/08/2014   ALT 41 01/08/2014   ALKPHOS 119 (H) 01/08/2014   BILITOT 0.5 01/08/2014   GFRNONAA >60 01/08/2014   GFRAA >60 01/08/2014    Lab Results  Component Value Date   WBC 5.2 02/17/2017   NEUTROABS 3.0 02/17/2017   HGB 14.8 02/17/2017   HCT 43.5 02/17/2017   MCV 86.8 02/17/2017   PLT 153 02/17/2017   Lab Results  Component Value Date   CEA 0.8 08/19/2016    STUDIES: No results found.  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  PLAN:    1. 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 Patient completed 12 cycles of adjuvant FOLFOX chemotherapy on January 08, 2014. Patient had a colonoscopy on September 09, 2014 that was reported as normal, repeat in July 2019. CT scan in December 2015 did not reveal evidence of recurrence. No further imaging is necessary unless there is suspicion. Patient's CEA continues to be within normal limits, today's result is pending. No intervention is needed at this time. Return to clinic in 6 months with repeat laboratory work and further evaluation. Patient can likely be switched to laboratory work every 6 months and evaluation once a year at this point. 2.  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 Continue gabapentin as needed during the winter months. 3. Hypertension: Patient's blood pressure is elevated today. Continue monitoring treatment per primary care.  Patient expressed understanding and was in agreement with this plan. He also understands that He can call clinic at any time with any questions, concerns, or complaints.    [name], MD 09/03/17 8:40 AM

## 2017-09-08 ENCOUNTER — Ambulatory Visit: Payer: BLUE CROSS/BLUE SHIELD | Admitting: Oncology

## 2017-09-08 ENCOUNTER — Other Ambulatory Visit: Payer: BLUE CROSS/BLUE SHIELD

## 2017-10-04 ENCOUNTER — Encounter: Payer: Self-pay | Admitting: *Deleted

## 2017-10-10 ENCOUNTER — Ambulatory Visit (INDEPENDENT_AMBULATORY_CARE_PROVIDER_SITE_OTHER): Payer: BLUE CROSS/BLUE SHIELD | Admitting: General Surgery

## 2017-10-10 ENCOUNTER — Encounter: Payer: Self-pay | Admitting: General Surgery

## 2017-10-10 VITALS — BP 186/96 | HR 95 | Resp 16 | Ht 66.0 in | Wt 239.0 lb

## 2017-10-10 DIAGNOSIS — Z85038 Personal history of other malignant neoplasm of large intestine: Secondary | ICD-10-CM

## 2017-10-10 NOTE — Progress Notes (Signed)
Patient ID: Paul Irwin, male   DOB: 03/17/1955, 62 y.o.   MRN: [id number]  Chief Complaint  Patient presents with  . Colonoscopy    HPI Paul Irwin is a 62 y.o. male.  Who presents for a colonoscopy discussion, history of colon cancer. The last colonoscopy was completed on 09-09-14. Denies any gastrointestinal issues. Bowels move regular and no bleeding noted. He states that within the past 6 months he is slowing down some. He is under stress this morning from issues at work. 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 He is here with his wife, Paul Irwin.  HPI  Past Medical History:  Diagnosis Date  . Atrial fibrillation ([place])   . GERD (gastroesophageal reflux disease)   . Kidney stones   . Malignant neoplasm of sigmoid colon ([place]) 2015   T3,N1a. Moderately well differentiated.  1 / 13 nodes positive; adjuvant chemotherapy.   . Sleep apnea     Past Surgical History:  Procedure Laterality Date  . COLON SURGERY  06/20/2013   Sigmoid colectomy. E5,U3J. Moderately well differentiated.  1/13 nodes positive  . colonoscopy  06/10/13   Dr. Vira Agar  . COLONOSCOPY N/A 09/09/2014   Procedure: COLONOSCOPY;  Surgeon: Robert Bellow, MD;  Location: [facility] ENDOSCOPY;  Service: Endoscopy;  Laterality: N/A;  . SIGMOIDOSCOPY  06/20/13  . WRIST SURGERY Right 2001    Family History  Problem Relation Age of Onset  . Heart disease Mother   . Heart disease Father   . Colon cancer Neg Hx     Social History Social History   Tobacco Use  . Smoking status: Never Smoker  . Smokeless tobacco: Never Used  Substance Use Topics  . Alcohol use: No  . Drug use: No    No Known Allergies  Current Outpatient Medications  Medication Sig Dispense Refill  . gabapentin (NEURONTIN) 100 MG capsule Take 1 capsule (100 mg total) by mouth 2 (two) times daily. (Patient not taking: Reported on 10/10/2017) 180 capsule 1  . polyethylene glycol powder (GLYCOLAX/MIRALAX) powder Take 255 g by mouth  once for 1 dose. Mix whole container with 64 ounces of clear liquids, No Red liquids. 255 g 0  . traZODone (DESYREL) 150 MG tablet Take 1 tablet (150 mg total) by mouth at bedtime as needed for sleep. (Patient not taking: Reported on 10/10/2017) 90 tablet 0   No current facility-administered medications for this visit.     Review of Systems Review of Systems  Constitutional: Negative.   Respiratory: Negative.   Cardiovascular: Negative.   Genitourinary: Positive for urgency.    Blood pressure (!) 186/96, pulse 95, resp. rate 16, height 5\' 6"  (1.676 m), weight 239 lb (108.4 kg), SpO2 97 %.  Physical Exam Physical Exam  Constitutional: He is oriented to person, place, and time. He appears well-developed and well-nourished.  HENT:  Mouth/Throat: No oropharyngeal exudate.  Eyes: Conjunctivae are normal. No scleral icterus.  Neck: Neck supple.  Cardiovascular: Normal rate and normal heart sounds. An irregular rhythm present.  Pulmonary/Chest: Effort normal and breath sounds normal.  Abdominal: Soft. Bowel sounds are normal.  Neurological: He is alert and oriented to person, place, and time.  Skin: Skin is warm and dry.  Psychiatric: His behavior is normal.    Data Reviewed September 09, 2014 colonoscopy, 1 year post sigmoid resection was normal. The most recent CEA dated February 17, 2017 was normal at 0.7. Preoperative CEA: 3.0. CBC dated February 17, 2017 showed a hemoglobin of 14.8  with an MCV of 86.8.  White blood cell count 5200, platelet count 153,000. Basic metabolic panel dated June 23, 2016 was normal.  Creatinine 0.9 with an estimated GFR of 86.  Normal electrolytes.  Assessment    Candidate for follow-up colonoscopy.    Plan    Colonoscopy with possible biopsy/polypectomy prn: Information regarding the procedure, including its potential risks and complications (including but not limited to perforation of the bowel, which may require emergency surgery to repair, and  bleeding) was verbally given to the patient. Educational information regarding lower intestinal endoscopy was given to the patient. Written instructions for how to complete the bowel prep using Miralax were provided. The importance of drinking ample fluids to avoid dehydration as a result of the prep emphasized.     HPI, Physical Exam, Assessment and Plan have been scribed under the direction and in the presence of Robert Bellow, MD. Karie Fetch, RN  I have completed the exam and reviewed the above documentation for accuracy and completeness.  I agree with the above.  Haematologist has been used and any errors in dictation or transcription are unintentional.  Hervey Ard, M.D., F.A.C.S.  The patient is scheduled for a Colonoscopy at [facility] on 11/01/17. They are aware to call the day before to get their arrival time. Miralax prescription has been sent into the patient's pharmacy. The patient is aware of date and instructions.  Documented by Caryl-Lyn Otis Brace LPN   Forest Gleason Bryston Colocho 10/11/2017, 6:50 PM

## 2017-10-10 NOTE — Patient Instructions (Addendum)
The patient is aware to call back for any questions or new concerns.  Colonoscopy, Adult A colonoscopy is an exam to look at the entire large intestine. During the exam, a lubricated, bendable tube is inserted into the anus and then passed into the rectum, colon, and other parts of the large intestine. A colonoscopy is often done as a part of normal colorectal screening or in response to certain symptoms, such as anemia, persistent diarrhea, abdominal pain, and blood in the stool. The exam can help screen for and diagnose medical problems, including:  Tumors.  Polyps.  Inflammation.  Areas of bleeding.  Tell a health care provider about:  Any allergies you have.  All medicines you are taking, including vitamins, herbs, eye drops, creams, and over-the-counter medicines.  Any problems you or family members have had with anesthetic medicines.  Any blood disorders you have.  Any surgeries you have had.  Any medical conditions you have.  Any problems you have had passing stool. What are the risks? Generally, this is a safe procedure. However, problems may occur, including:  Bleeding.  A tear in the intestine.  A reaction to medicines given during the exam.  Infection (rare).  What happens before the procedure? Eating and drinking restrictions Follow instructions from your health care provider about eating and drinking, which may include:  A few days before the procedure - follow a low-fiber diet. Avoid nuts, seeds, dried fruit, raw fruits, and vegetables.  1-3 days before the procedure - follow a clear liquid diet. Drink only clear liquids, such as clear broth or bouillon, black coffee or tea, clear juice, clear soft drinks or sports drinks, gelatin dessert, and popsicles. Avoid any liquids that contain red or purple dye.  On the day of the procedure - do not eat or drink anything during the 2 hours before the procedure, or within the time period that your health care  provider recommends.  Bowel prep If you were prescribed an oral bowel prep to clean out your colon:  Take it as told by your health care provider. Starting the day before your procedure, you will need to drink a large amount of medicated liquid. The liquid will cause you to have multiple loose stools until your stool is almost clear or light green.  If your skin or anus gets irritated from diarrhea, you may use these to relieve the irritation: ? Medicated wipes, such as adult wet wipes with aloe and vitamin E. ? A skin soothing-product like petroleum jelly.  If you vomit while drinking the bowel prep, take a break for up to 60 minutes and then begin the bowel prep again. If vomiting continues and you cannot take the bowel prep without vomiting, call your health care provider.  General instructions  Ask your health care provider about changing or stopping your regular medicines. This is especially important if you are taking diabetes medicines or blood thinners.  Plan to have someone take you home from the hospital or clinic. What happens during the procedure?  An IV tube may be inserted into one of your veins.  You will be given medicine to help you relax (sedative).  To reduce your risk of infection: ? Your health care team will wash or sanitize their hands. ? Your anal area will be washed with soap.  You will be asked to lie on your side with your knees bent.  Your health care provider will lubricate a long, thin, flexible tube. The tube will have a camera  and a light on the end.  The tube will be inserted into your anus.  The tube will be gently eased through your rectum and colon.  Air will be delivered into your colon to keep it open. You may feel some pressure or cramping.  The camera will be used to take images during the procedure.  A small tissue sample may be removed from your body to be examined under a microscope (biopsy). If any potential problems are found, the  tissue will be sent to a lab for testing.  If small polyps are found, your health care provider may remove them and have them checked for cancer cells.  The tube that was inserted into your anus will be slowly removed. The procedure may vary among health care providers and hospitals. What happens after the procedure?  Your blood pressure, heart rate, breathing rate, and blood oxygen level will be monitored until the medicines you were given have worn off.  Do not drive for 24 hours after the exam.  You may have a small amount of blood in your stool.  You may pass gas and have mild abdominal cramping or bloating due to the air that was used to inflate your colon during the exam.  It is up to you to get the results of your procedure. Ask your health care provider, or the department performing the procedure, when your results will be ready. This information is not intended to replace advice given to you by your health care provider. Make sure you discuss any questions you have with your health care provider. Document Released: 02/12/2000 Document Revised: 12/16/2015 Document Reviewed: 04/28/2015 Elsevier Interactive Patient Education  [employer].  The patient is scheduled for a Colonoscopy at [facility] on 11/01/17. They are aware to call the day before to get their arrival time. Miralax prescription has been sent into the patient's pharmacy. The patient is aware of date and instructions.

## 2017-10-11 ENCOUNTER — Encounter: Payer: Self-pay | Admitting: General Surgery

## 2017-10-11 DIAGNOSIS — Z85038 Personal history of other malignant neoplasm of large intestine: Secondary | ICD-10-CM | POA: Insufficient documentation

## 2017-10-11 MED ORDER — POLYETHYLENE GLYCOL 3350 17 GM/SCOOP PO POWD: 1.0000 | Freq: Once | ORAL | 0 refills | Status: AC

## 2017-10-31 ENCOUNTER — Encounter: Payer: Self-pay | Admitting: Student

## 2017-11-01 ENCOUNTER — Encounter: Payer: Self-pay | Admitting: *Deleted

## 2017-11-01 ENCOUNTER — Ambulatory Visit: Payer: BLUE CROSS/BLUE SHIELD | Admitting: Anesthesiology

## 2017-11-01 ENCOUNTER — Encounter
Admission: RE | Disposition: A | Discharge status: Home / Self Care | Payer: Self-pay | Source: Ambulatory Visit | Attending: General Surgery

## 2017-11-01 ENCOUNTER — Ambulatory Visit
Admission: RE | Admit: 2017-11-01 | Discharge: 2017-11-01 | Disposition: A | Discharge status: Home / Self Care | Payer: BLUE CROSS/BLUE SHIELD | Source: Ambulatory Visit | Attending: General Surgery | Admitting: General Surgery

## 2017-11-01 DIAGNOSIS — Z8249 Family history of ischemic heart disease and other diseases of the circulatory system: Secondary | ICD-10-CM | POA: Insufficient documentation

## 2017-11-01 DIAGNOSIS — K219 Gastro-esophageal reflux disease without esophagitis: Secondary | ICD-10-CM | POA: Insufficient documentation

## 2017-11-01 DIAGNOSIS — G4730 Sleep apnea, unspecified: Secondary | ICD-10-CM | POA: Diagnosis not present

## 2017-11-01 DIAGNOSIS — Z85038 Personal history of other malignant neoplasm of large intestine: Secondary | ICD-10-CM

## 2017-11-01 DIAGNOSIS — D123 Benign neoplasm of transverse colon: Secondary | ICD-10-CM | POA: Diagnosis not present

## 2017-11-01 DIAGNOSIS — I4891 Unspecified atrial fibrillation: Secondary | ICD-10-CM | POA: Insufficient documentation

## 2017-11-01 DIAGNOSIS — Z87442 Personal history of urinary calculi: Secondary | ICD-10-CM | POA: Insufficient documentation

## 2017-11-01 DIAGNOSIS — Z1211 Encounter for screening for malignant neoplasm of colon: Secondary | ICD-10-CM | POA: Insufficient documentation

## 2017-11-01 DIAGNOSIS — Z79899 Other long term (current) drug therapy: Secondary | ICD-10-CM | POA: Diagnosis not present

## 2017-11-01 DIAGNOSIS — K621 Rectal polyp: Secondary | ICD-10-CM | POA: Insufficient documentation

## 2017-11-01 HISTORY — PX: COLONOSCOPY WITH PROPOFOL: SHX5780

## 2017-11-01 SURGERY — COLONOSCOPY WITH PROPOFOL
Anesthesia: General

## 2017-11-01 MED ORDER — LIDOCAINE HCL (PF) 2 % IJ SOLN
INTRAMUSCULAR | Status: AC
  Filled 2017-11-01: qty 10

## 2017-11-01 MED ORDER — MIDAZOLAM HCL 2 MG/2ML IJ SOLN
INTRAMUSCULAR | Status: AC
  Filled 2017-11-01: qty 2

## 2017-11-01 MED ORDER — SUCCINYLCHOLINE CHLORIDE 20 MG/ML IJ SOLN
INTRAMUSCULAR | Status: AC
  Filled 2017-11-01: qty 1

## 2017-11-01 MED ORDER — PHENYLEPHRINE HCL 10 MG/ML IJ SOLN
INTRAMUSCULAR | Status: AC
  Filled 2017-11-01: qty 1

## 2017-11-01 MED ORDER — PROPOFOL 500 MG/50ML IV EMUL
INTRAVENOUS | Status: AC
  Filled 2017-11-01: qty 50

## 2017-11-01 MED ORDER — SODIUM CHLORIDE 0.9 % IV SOLN
INTRAVENOUS | Status: DC
  Administered 2017-11-01: 07:00:00 via INTRAVENOUS

## 2017-11-01 MED ORDER — GLYCOPYRROLATE 0.2 MG/ML IJ SOLN
INTRAMUSCULAR | Status: AC
  Filled 2017-11-01: qty 1

## 2017-11-01 MED ORDER — PROPOFOL 10 MG/ML IV BOLUS
INTRAVENOUS | Status: DC | PRN
  Administered 2017-11-01: 60 mg via INTRAVENOUS

## 2017-11-01 MED ORDER — MIDAZOLAM HCL 2 MG/2ML IJ SOLN
INTRAMUSCULAR | Status: DC | PRN
  Administered 2017-11-01: 2 mg via INTRAVENOUS

## 2017-11-01 MED ORDER — PROPOFOL 500 MG/50ML IV EMUL
INTRAVENOUS | Status: DC | PRN
  Administered 2017-11-01: 150 ug/kg/min via INTRAVENOUS

## 2017-11-01 NOTE — Anesthesia Post-op Follow-up Note (Signed)
Anesthesia QCDR form completed.        

## 2017-11-01 NOTE — Anesthesia Preprocedure Evaluation (Addendum)
Anesthesia Evaluation  Patient identified by MRN, date of birth, ID band Patient awake    Reviewed: Allergy & Precautions, H&P , NPO status , Patient's Chart, lab work & pertinent test results  Airway Mallampati: III   Neck ROM: full   Comment: TM 3 FB Dental   Pulmonary sleep apnea ,           Cardiovascular negative cardio ROS  + dysrhythmias Atrial Fibrillation      Neuro/Psych negative neurological ROS  negative psych ROS   GI/Hepatic negative GI ROS, Neg liver ROS, GERD  ,  Endo/Other  negative endocrine ROS  Renal/GU Renal disease (renal stones)  negative genitourinary   Musculoskeletal   Abdominal   Peds  Hematology negative hematology ROS (+)   Anesthesia Other Findings Past Medical History: No date: Atrial fibrillation ([facility]) No date: GERD (gastroesophageal reflux disease) No date: Kidney stones 2015: Malignant neoplasm of sigmoid colon ([place])     Comment:  T3,N1a. Moderately well differentiated.  1 / 13 nodes               positive; adjuvant chemotherapy.  No date: Sleep apnea  Past Surgical History: 06/20/2013: COLON SURGERY     Comment:  Sigmoid colectomy. Y8,F0Y. Moderately well               differentiated.  1/13 nodes positive 06/10/13: colonoscopy     Comment:  Dr. Vira Agar 09/09/2014: COLONOSCOPY; N/A     Comment:  Procedure: COLONOSCOPY;  Surgeon: Robert Bellow, MD;              Location: [facility]COPY;  Service: Endoscopy;                Laterality: N/A; 06/20/13: SIGMOIDOSCOPY 2001: WRIST SURGERY; Right  BMI    Body Mass Index:  36.34 kg/m      Reproductive/Obstetrics negative OB ROS                            Anesthesia Physical Anesthesia Plan  ASA: III  Anesthesia Plan: General   Post-op Pain Management:    Induction:   PONV Risk Score and Plan: Propofol infusion and TIVA  Airway Management Planned: Nasal Cannula and Natural  Airway  Additional Equipment:   Intra-op Plan:   Post-operative Plan:   Informed Consent: I have reviewed the patients History and Physical, chart, labs and discussed the procedure including the risks, benefits and alternatives for the proposed anesthesia with the patient or authorized representative who has indicated his/her understanding and acceptance.   Dental Advisory Given  Plan Discussed with: Anesthesiologist, CRNA and Surgeon  Anesthesia Plan Comments:         Anesthesia Quick Evaluation

## 2017-11-01 NOTE — Transfer of Care (Signed)
Immediate Anesthesia Transfer of Care Note  Patient: Paul Irwin  Procedure(s) Performed: Procedure(s): COLONOSCOPY WITH PROPOFOL (N/A)  Patient Location: PACU and Endoscopy Unit  Anesthesia Type:General  Level of Consciousness: sedated  Airway & Oxygen Therapy: Patient Spontanous Breathing and Patient connected to nasal cannula oxygen  Post-op Assessment: Report given to RN and Post -op Vital signs reviewed and stable  Post vital signs: Reviewed and stable  Last Vitals:  Vitals:   11/01/17 0656 11/01/17 0756  BP: (!) 141/91 100/64  Pulse: 82 80  Resp: 16 14  Temp: (!) 36 C 36.4 C  SpO2: 67% 28%    Complications: No apparent anesthesia complications

## 2017-11-01 NOTE — H&P (Signed)
No change in clinical history or exam. For colonoscopy. 

## 2017-11-01 NOTE — Op Note (Signed)
[facility] Gastroenterology Patient Name: Paul Irwin Procedure Date: 11/01/2017 7:31 AM MRN: [id number] Account #: [id number] Date of Birth: [birth year] Admit Type: Outpatient Age: 62 Room: [facility] ENDO ROOM 4 Gender: Male Note Status: Finalized Procedure:            Colonoscopy Indications:          High risk colon cancer surveillance: Personal history                        of colon cancer Providers:            Robert Bellow, MD Referring MD:         Ocie Cornfield. Ouida Sills MD, MD (Referring MD) Medicines:            Monitored Anesthesia Care Complications:        No immediate complications. Procedure:            Pre-Anesthesia Assessment:                       - Prior to the procedure, a History and Physical was                        performed, and patient medications, allergies and                        sensitivities were reviewed. The patient's tolerance of                        previous anesthesia was reviewed.                       - The risks and benefits of the procedure and the                        sedation options and risks were discussed with the                        patient. All questions were answered and informed                        consent was obtained.                       After obtaining informed consent, the colonoscope was                        passed under direct vision. Throughout the procedure,                        the patient's blood pressure, pulse, and oxygen                        saturations were monitored continuously. The                        Colonoscope was introduced through the anus and                        advanced to the the cecum, identified by appendiceal  orifice and ileocecal valve. The colonoscopy was                        performed without difficulty. The patient tolerated the                        procedure well. The quality of the bowel preparation                        was  good. Findings:      Two sessile polyps were found in the rectum and splenic flexure. The       polyps were 5 mm in size. These were biopsied with a cold large-capacity       forceps for histology.      The retroflexed view of the distal rectum and anal verge was normal and       showed no anal or rectal abnormalities. Impression:           - Two 5 mm polyps in the rectum and at the splenic                        flexure. Biopsied.                       - The distal rectum and anal verge are normal on                        retroflexion view. Recommendation:       - Telephone endoscopist for pathology results in 1 week. Procedure Code(s):    --- Professional ---                       [phone], Colonoscopy, flexible; with biopsy, single or                        multiple Diagnosis Code(s):    --- Professional ---                       Z85.038, Personal history of other malignant neoplasm                        of large intestine                       K62.1, Rectal polyp                       D12.3, Benign neoplasm of transverse colon (hepatic                        flexure or splenic flexure) CPT copyright 2017 American Medical Association. All rights reserved. The codes documented in this report are preliminary and upon coder review may  be revised to meet current compliance requirements. Robert Bellow, MD 11/01/2017 7:56:04 AM This report has been signed electronically. Number of Addenda: 0 Note Initiated On: 11/01/2017 7:31 AM Scope Withdrawal Time: 0 hours 9 minutes 49 seconds  Total Procedure Duration: 0 hours 16 minutes 35 seconds       [facility]

## 2017-11-01 NOTE — Anesthesia Postprocedure Evaluation (Signed)
Anesthesia Post Note  Patient: Paul Irwin  Procedure(s) Performed: COLONOSCOPY WITH PROPOFOL (N/A )  Patient location during evaluation: PACU Anesthesia Type: General Level of consciousness: awake and alert Pain management: pain level controlled Vital Signs Assessment: post-procedure vital signs reviewed and stable Respiratory status: spontaneous breathing, nonlabored ventilation and respiratory function stable Cardiovascular status: blood pressure returned to baseline and stable Postop Assessment: no apparent nausea or vomiting Anesthetic complications: no     Last Vitals:  Vitals:   11/01/17 0656 11/01/17 0756  BP: (!) 141/91 100/64  Pulse: 82 80  Resp: 16 14  Temp: (!) 36 C 36.4 C  SpO2: 98% 93%    Last Pain:  Vitals:   11/01/17 0821  TempSrc:   PainSc: 0-No pain                 Durenda Hurt

## 2017-11-01 NOTE — Anesthesia Procedure Notes (Signed)
Date/Time: 11/01/2017 7:35 AM Performed by: Doreen Salvage, CRNA Pre-anesthesia Checklist: Patient identified, Emergency Drugs available, Suction available and Patient being monitored Patient Re-evaluated:Patient Re-evaluated prior to induction Oxygen Delivery Method: Nasal cannula Induction Type: IV induction Dental Injury: Teeth and Oropharynx as per pre-operative assessment  Comments: Nasal cannula with etCO2 monitoring

## 2017-11-02 ENCOUNTER — Encounter: Payer: Self-pay | Admitting: General Surgery

## 2017-11-04 LAB — SURGICAL PATHOLOGY

## 2017-11-06 ENCOUNTER — Other Ambulatory Visit: Payer: Self-pay | Admitting: *Deleted

## 2017-11-10 ENCOUNTER — Other Ambulatory Visit: Payer: BLUE CROSS/BLUE SHIELD

## 2017-11-10 ENCOUNTER — Ambulatory Visit: Payer: BLUE CROSS/BLUE SHIELD | Admitting: Oncology

## 2017-11-12 NOTE — Progress Notes (Signed)
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  INTERVAL HISTORY: Patient returns to clinic today for repeat laboratory can routine yearly evaluation.  He currently feels well and is asymptomatic.  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  He has no other neurologic complaints. He denies any weakness or fatigue.  He denies any recent fevers or illnesses.  He denies any chest pain or shortness of breath.  He denies any nausea, vomiting, constipation, or diarrhea. He has no melena or hematochezia.  Patient feels at his baseline offers no specific complaints today.  REVIEW OF SYSTEMS:   Review of Systems  Constitutional: Negative.  Negative for fever, malaise/fatigue and weight loss.  Respiratory: Negative.  Negative for cough and shortness of breath.   Cardiovascular: Negative.  Negative for chest pain and leg swelling.  Gastrointestinal: Negative.  Negative for abdominal pain, blood in stool, constipation, diarrhea, melena, nausea and vomiting.  Genitourinary: Negative.  Negative for dysuria.  Musculoskeletal: Negative.  Negative for back pain.  Skin: Negative.  Negative for rash.  Neurological: Positive for sensory change. Negative for dizziness, focal weakness, weakness and headaches.  Psychiatric/Behavioral: Negative.  The patient is not nervous/anxious.     As per HPI. Otherwise, a complete review of systems is negative.  PAST MEDICAL HISTORY: Past Medical History:  Diagnosis Date  . Atrial fibrillation ([place])   . GERD (gastroesophageal reflux disease)   . Kidney stones   . Malignant neoplasm of sigmoid colon ([place]) 2015   T3,N1a. Moderately  well differentiated.  1 / 13 nodes positive; adjuvant chemotherapy.   . Sleep apnea     PAST SURGICAL HISTORY: Past Surgical History:  Procedure Laterality Date  . COLON SURGERY  06/20/2013   Sigmoid colectomy. L8,V5I. Moderately well differentiated.  1/13 nodes positive  . colonoscopy  06/10/13   Dr. Vira Agar  . COLONOSCOPY N/A 09/09/2014   Procedure: COLONOSCOPY;  Surgeon: Robert Bellow, MD;  Location: [facility] ENDOSCOPY;  Service: Endoscopy;  Laterality: N/A;  . COLONOSCOPY WITH PROPOFOL N/A 11/01/2017   Procedure: COLONOSCOPY WITH PROPOFOL;  Surgeon: Robert Bellow, MD;  Location: [facility] ENDOSCOPY;  Service: Endoscopy;  Laterality: N/A;  . SIGMOIDOSCOPY  06/20/13  . WRIST SURGERY Right 2001    FAMILY HISTORY Family History  Problem Relation Age of Onset  . Heart disease Mother   . Heart disease Father   . Colon cancer Neg Hx        ADVANCED DIRECTIVES:    HEALTH MAINTENANCE: Social History   Tobacco Use  . Smoking status: Never Smoker  . Smokeless tobacco: Never Used  Substance Use Topics  . Alcohol use: No  . Drug use: No     Colonoscopy:  PAP:  Bone density:  Lipid panel:  No Known Allergies  Current Outpatient Medications  Medication Sig Dispense Refill  . gabapentin (NEURONTIN) 100 MG capsule Take 1 capsule (100 mg total) by mouth 2 (two) times daily. (Patient taking differently: Take 100 mg by mouth 2 (two) times daily as needed. ) 180 capsule 1   No current facility-administered medications for this visit.     OBJECTIVE: Vitals:   11/17/17 1001  BP: 138/80  Pulse: 78  Resp: 20  Temp: 97.8 F (36.6 C)     Body mass index is 35 kg/m.    ECOG FS:0 - Asymptomatic  General: Well-developed, well-nourished, no acute distress. Eyes: Pink conjunctiva, anicteric sclera. HEENT: Normocephalic, moist mucous membranes. Lungs: Clear to auscultation bilaterally. Heart: Regular rate and rhythm. No rubs, murmurs, or gallops. Abdomen: Soft, nontender,  nondistended. No organomegaly noted, normoactive bowel sounds. Musculoskeletal: No edema, cyanosis, or clubbing. Neuro: Alert, answering all questions appropriately. Cranial nerves grossly intact. Skin: No rashes or petechiae noted. Psych: Normal affect.  LAB RESULTS:  Lab Results  Component Value Date   NA 139 01/08/2014   K 3.5 01/08/2014   CL 103 01/08/2014   CO2 27 01/08/2014   GLUCOSE 123 (H) 01/08/2014   BUN 12 01/08/2014   CREATININE 1.18 01/08/2014   CALCIUM 8.9 01/08/2014   PROT 6.5 01/08/2014   ALBUMIN 3.6 01/08/2014   AST 34 01/08/2014   ALT 41 01/08/2014   ALKPHOS 119 (H) 01/08/2014   BILITOT 0.5 01/08/2014   GFRNONAA >60 01/08/2014   GFRAA >60 01/08/2014    Lab Results  Component Value Date   WBC 5.5 11/17/2017   NEUTROABS 3.7 11/17/2017   HGB 14.7 11/17/2017   HCT 43.6 11/17/2017   MCV 87.6 11/17/2017   PLT 159 11/17/2017   Lab Results  Component Value Date   CEA 0.8 08/19/2016    STUDIES: No results found.  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  PLAN:    1. 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 Patient completed 12 cycles of adjuvant FOLFOX chemotherapy on January 08, 2014. CT scan in December 2015 did not reveal evidence of recurrence. No further imaging is necessary unless there is suspicion. Patient's CEA continues to be within normal limits, today's result is pending.  Patient's most recent colonoscopy on November 01, 2017 did not reveal evidence of recurrence and recommendation was to repeat in 5 years.  Return to clinic in 6 months for laboratory work only and then in 1 year for laboratory work and further evaluation.  At this point, patient will be greater than 5 years removed from completing his chemotherapy and can consider discharging him from clinic.   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  Patient no longer takes gabapentin.   3.  Hypertension: Patient's blood  pressure is within normal limits today.  Continue follow-up and treatment with primary care.  I spent a total of 20 minutes face-to-face with the patient of which greater than 50% of the visit was spent in counseling and coordination of care as detailed above.  Patient expressed understanding and was in agreement with this plan. He also understands that He can call clinic at any time with any questions, concerns, or complaints.    [name], MD 11/17/17 11:55 AM

## 2017-11-17 ENCOUNTER — Inpatient Hospital Stay: Payer: BLUE CROSS/BLUE SHIELD | Attending: Oncology | Admitting: Oncology

## 2017-11-17 ENCOUNTER — Inpatient Hospital Stay: Payer: BLUE CROSS/BLUE SHIELD

## 2017-11-17 ENCOUNTER — Encounter: Payer: Self-pay | Admitting: Oncology

## 2017-11-17 VITALS — BP 138/80 | HR 78 | Temp 97.8°F | Resp 20 | Wt 230.2 lb

## 2017-11-17 DIAGNOSIS — G629 Polyneuropathy, unspecified: Secondary | ICD-10-CM | POA: Insufficient documentation

## 2017-11-17 DIAGNOSIS — I10 Essential (primary) hypertension: Secondary | ICD-10-CM | POA: Diagnosis not present

## 2017-11-17 DIAGNOSIS — Z79899 Other long term (current) drug therapy: Secondary | ICD-10-CM | POA: Diagnosis not present

## 2017-11-17 DIAGNOSIS — C189 Malignant neoplasm of colon, unspecified: Secondary | ICD-10-CM

## 2017-11-17 DIAGNOSIS — C187 Malignant neoplasm of sigmoid colon: Secondary | ICD-10-CM | POA: Insufficient documentation

## 2017-11-17 LAB — CBC WITH DIFFERENTIAL/PLATELET
BASOS ABS: 0 10*3/uL (ref 0–0.1)
BASOS PCT: 1 %
EOS PCT: 1 %
Eosinophils Absolute: 0.1 10*3/uL (ref 0–0.7)
HCT: 43.6 % (ref 40.0–52.0)
Hemoglobin: 14.7 g/dL (ref 13.0–18.0)
Lymphocytes Relative: 26 %
Lymphs Abs: 1.4 10*3/uL (ref 1.0–3.6)
MCH: 29.5 pg (ref 26.0–34.0)
MCHC: 33.6 g/dL (ref 32.0–36.0)
MCV: 87.6 fL (ref 80.0–100.0)
Monocytes Absolute: 0.3 10*3/uL (ref 0.2–1.0)
Monocytes Relative: 6 %
Neutro Abs: 3.7 10*3/uL (ref 1.4–6.5)
Neutrophils Relative %: 66 %
PLATELETS: 159 10*3/uL (ref 150–440)
RBC: 4.98 MIL/uL (ref 4.40–5.90)
RDW: 13.7 % (ref 11.5–14.5)
WBC: 5.5 10*3/uL (ref 3.8–10.6)

## 2017-11-17 NOTE — Progress Notes (Signed)
Patient denies any concerns today.  

## 2017-11-18 LAB — CEA: CEA1: 0.5 ng/mL (ref 0.0–4.7)

## 2018-05-18 ENCOUNTER — Inpatient Hospital Stay: Payer: BLUE CROSS/BLUE SHIELD | Attending: Hematology and Oncology

## 2018-11-16 NOTE — Progress Notes (Deleted)
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  INTERVAL HISTORY: Patient returns to clinic today for repeat laboratory can routine yearly evaluation.  He currently feels well and is asymptomatic.  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  He has no other neurologic complaints. He denies any weakness or fatigue.  He denies any recent fevers or illnesses.  He denies any chest pain or shortness of breath.  He denies any nausea, vomiting, constipation, or diarrhea. He has no melena or hematochezia.  Patient feels at his baseline offers no specific complaints today.  REVIEW OF SYSTEMS:   Review of Systems  Constitutional: Negative.  Negative for fever, malaise/fatigue and weight loss.  Respiratory: Negative.  Negative for cough and shortness of breath.   Cardiovascular: Negative.  Negative for chest pain and leg swelling.  Gastrointestinal: Negative.  Negative for abdominal pain, blood in stool, constipation, diarrhea, melena, nausea and vomiting.  Genitourinary: Negative.  Negative for dysuria.  Musculoskeletal: Negative.  Negative for back pain.  Skin: Negative.  Negative for rash.  Neurological: Positive for sensory change. Negative for dizziness, focal weakness, weakness and headaches.  Psychiatric/Behavioral: Negative.  The patient is not nervous/anxious.     As per HPI. Otherwise, a complete review of systems is negative.  PAST MEDICAL HISTORY: Past Medical History:  Diagnosis Date  . Atrial fibrillation ([place])   . GERD (gastroesophageal reflux disease)   . Kidney stones   . Malignant neoplasm of sigmoid colon ([place]) 2015   T3,N1a. Moderately  well differentiated.  1 / 13 nodes positive; adjuvant chemotherapy.   . Sleep apnea     PAST SURGICAL HISTORY: Past Surgical History:  Procedure Laterality Date  . COLON SURGERY  06/20/2013   Sigmoid colectomy. [id number]. Moderately well differentiated.  1/13 nodes positive  . colonoscopy  06/10/13   Dr. Vira Agar  . COLONOSCOPY N/A 09/09/2014   Procedure: COLONOSCOPY;  Surgeon: Robert Bellow, MD;  Location: [facility] ENDOSCOPY;  Service: Endoscopy;  Laterality: N/A;  . COLONOSCOPY WITH PROPOFOL N/A 11/01/2017   Procedure: COLONOSCOPY WITH PROPOFOL;  Surgeon: Robert Bellow, MD;  Location: [facility] ENDOSCOPY;  Service: Endoscopy;  Laterality: N/A;  . SIGMOIDOSCOPY  06/20/13  . WRIST SURGERY Right 2001    FAMILY HISTORY Family History  Problem Relation Age of Onset  . Heart disease Mother   . Heart disease Father   . Colon cancer Neg Hx        ADVANCED DIRECTIVES:    HEALTH MAINTENANCE: Social History   Tobacco Use  . Smoking status: Never Smoker  . Smokeless tobacco: Never Used  Substance Use Topics  . Alcohol use: No  . Drug use: No     Colonoscopy:  PAP:  Bone density:  Lipid panel:  No Known Allergies  Current Outpatient Medications  Medication Sig Dispense Refill  . gabapentin (NEURONTIN) 100 MG capsule Take 1 capsule (100 mg total) by mouth 2 (two) times daily. (Patient taking differently: Take 100 mg by mouth 2 (two) times daily as needed. ) 180 capsule 1   No current facility-administered medications for this visit.     OBJECTIVE: There were no vitals filed for this visit.   There is no height  or weight on file to calculate BMI.    ECOG FS:0 - Asymptomatic  General: Well-developed, well-nourished, no acute distress. Eyes: Pink conjunctiva, anicteric sclera. HEENT: Normocephalic, moist mucous membranes. Lungs: Clear to auscultation bilaterally. Heart: Regular rate and rhythm. No rubs, murmurs, or gallops. Abdomen: Soft, nontender, nondistended. No  organomegaly noted, normoactive bowel sounds. Musculoskeletal: No edema, cyanosis, or clubbing. Neuro: Alert, answering all questions appropriately. Cranial nerves grossly intact. Skin: No rashes or petechiae noted. Psych: Normal affect.  LAB RESULTS:  Lab Results  Component Value Date   NA 139 01/08/2014   K 3.5 01/08/2014   CL 103 01/08/2014   CO2 27 01/08/2014   GLUCOSE 123 (H) 01/08/2014   BUN 12 01/08/2014   CREATININE 1.18 01/08/2014   CALCIUM 8.9 01/08/2014   PROT 6.5 01/08/2014   ALBUMIN 3.6 01/08/2014   AST 34 01/08/2014   ALT 41 01/08/2014   ALKPHOS 119 (H) 01/08/2014   BILITOT 0.5 01/08/2014   GFRNONAA >60 01/08/2014   GFRAA >60 01/08/2014    Lab Results  Component Value Date   WBC 5.5 11/17/2017   NEUTROABS 3.7 11/17/2017   HGB 14.7 11/17/2017   HCT 43.6 11/17/2017   MCV 87.6 11/17/2017   PLT 159 11/17/2017   Lab Results  Component Value Date   CEA 0.8 08/19/2016    STUDIES: No results found.  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  PLAN:    1. 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 Patient completed 12 cycles of adjuvant FOLFOX chemotherapy on January 08, 2014. CT scan in December 2015 did not reveal evidence of recurrence. No further imaging is necessary unless there is suspicion. Patient's CEA continues to be within normal limits, today's result is pending.  Patient's most recent colonoscopy on November 01, 2017 did not reveal evidence of recurrence and recommendation was to repeat in 5 years.  Return to clinic in 6 months for laboratory work only and then in 1 year for laboratory work and further evaluation.  At this point, patient will be greater than 5 years removed from completing his chemotherapy and can consider discharging him from clinic.   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  Patient no longer takes gabapentin.   3.  Hypertension: Patient's blood pressure is within  normal limits today.  Continue follow-up and treatment with primary care.  I spent a total of 20 minutes face-to-face with the patient of which greater than 50% of the visit was spent in counseling and coordination of care as detailed above.  Patient expressed understanding and was in agreement with this plan. He also understands that He can call clinic at any time with any questions, concerns, or complaints.    [name], MD 11/16/18 12:01 AM

## 2018-11-23 ENCOUNTER — Inpatient Hospital Stay: Payer: BLUE CROSS/BLUE SHIELD | Admitting: Oncology

## 2018-11-23 ENCOUNTER — Inpatient Hospital Stay: Payer: BLUE CROSS/BLUE SHIELD

## 2018-12-02 NOTE — Progress Notes (Signed)
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  INTERVAL HISTORY: Patient returns to clinic today for repeat laboratory work and routine yearly evaluation.  He continues to feel well and remains asymptomatic.  He has a mild cold neuropathy that only bothers him during the winter months. He has no other neurologic complaints. He denies any weakness or fatigue.  He denies any recent fevers or illnesses.  He denies any chest pain, shortness of breath, cough, or hemoptysis.  He has noted no changes in his bowel movements.  He denies any nausea, vomiting, constipation, or diarrhea. He has no melena or hematochezia.  He has no urinary complaints.  Patient feels at his baseline offers no specific complaints today.  REVIEW OF SYSTEMS:   Review of Systems  Constitutional: Negative.  Negative for fever, malaise/fatigue and weight loss.  Respiratory: Negative.  Negative for cough and shortness of breath.   Cardiovascular: Negative.  Negative for chest pain and leg swelling.  Gastrointestinal: Negative.  Negative for abdominal pain, blood in stool, constipation, diarrhea, melena, nausea and vomiting.  Genitourinary: Negative.  Negative for dysuria.  Musculoskeletal: Negative.  Negative for back pain.  Skin: Negative.  Negative for rash.  Neurological: Positive for sensory change. Negative for dizziness, focal weakness, weakness and headaches.  Psychiatric/Behavioral: Negative.  The patient is not nervous/anxious.     As per HPI. Otherwise, a complete review of systems is negative.  PAST MEDICAL HISTORY: Past Medical History:  Diagnosis Date  . Atrial fibrillation ([place])   . GERD (gastroesophageal  reflux disease)   . Kidney stones   . Malignant neoplasm of sigmoid colon ([place]) 2015   T3,N1a. Moderately well differentiated.  1 / 13 nodes positive; adjuvant chemotherapy.   . Sleep apnea     PAST SURGICAL HISTORY: Past Surgical History:  Procedure Laterality Date  . COLON SURGERY  06/20/2013   Sigmoid colectomy. [id number]. Moderately well differentiated.  1/13 nodes positive  . colonoscopy  06/10/13   Dr. Vira Agar  . COLONOSCOPY N/A 09/09/2014   Procedure: COLONOSCOPY;  Surgeon: Robert Bellow, MD;  Location: [facility] ENDOSCOPY;  Service: Endoscopy;  Laterality: N/A;  . COLONOSCOPY WITH PROPOFOL N/A 11/01/2017   Procedure: COLONOSCOPY WITH PROPOFOL;  Surgeon: Robert Bellow, MD;  Location: [facility] ENDOSCOPY;  Service: Endoscopy;  Laterality: N/A;  . SIGMOIDOSCOPY  06/20/13  . WRIST SURGERY Right 2001    FAMILY HISTORY Family History  Problem Relation Age of Onset  . Heart disease Mother   . Heart disease Father   . Colon cancer Neg Hx        ADVANCED DIRECTIVES:    HEALTH MAINTENANCE: Social History   Tobacco Use  . Smoking status: Never Smoker  . Smokeless tobacco: Never Used  Substance Use Topics  . Alcohol use: No  . Drug use: No     Colonoscopy:  PAP:  Bone density:  Lipid panel:  No Known Allergies  Current Outpatient Medications  Medication Sig Dispense Refill  . gabapentin (NEURONTIN) 100 MG capsule Take 1 capsule (100 mg total) by mouth 2 (two) times daily. (Patient not taking: Reported on 12/04/2018) 180 capsule 1   No current facility-administered medications for this visit.     OBJECTIVE: Vitals:   12/04/18 1015  BP: (!) 157/94  Pulse: 76  Resp: 18  Temp: (!) 97.5 F (36.4 C)     Body mass index is 36.26 kg/m.    ECOG FS:0 - Asymptomatic  General: Well-developed, well-nourished, no acute distress. Eyes: Pink conjunctiva, anicteric sclera. HEENT: Normocephalic, moist mucous membranes. Lungs: Clear to auscultation bilaterally. Heart: Regular  rate and rhythm. No rubs, murmurs, or gallops. Abdomen: Soft, nontender, nondistended. No organomegaly noted, normoactive bowel sounds. Musculoskeletal: No edema, cyanosis, or clubbing. Neuro: Alert, answering all questions appropriately. Cranial nerves grossly intact. Skin: No rashes or petechiae noted. Psych: Normal affect.  LAB RESULTS:  Lab Results  Component Value Date   NA 139 01/08/2014   K 3.5 01/08/2014   CL 103 01/08/2014   CO2 27 01/08/2014   GLUCOSE 123 (H) 01/08/2014   BUN 12 01/08/2014   CREATININE 1.18 01/08/2014   CALCIUM 8.9 01/08/2014   PROT 6.5 01/08/2014   ALBUMIN 3.6 01/08/2014   AST 34 01/08/2014   ALT 41 01/08/2014   ALKPHOS 119 (H) 01/08/2014   BILITOT 0.5 01/08/2014   GFRNONAA >60 01/08/2014   GFRAA >60 01/08/2014    Lab Results  Component Value Date   WBC 5.9 12/04/2018   NEUTROABS 3.5 12/04/2018   HGB 14.4 12/04/2018   HCT 42.8 12/04/2018   MCV 87.5 12/04/2018   PLT 174 12/04/2018   Lab Results  Component Value Date   CEA 0.8 08/19/2016    STUDIES: No results found.  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  PLAN:    1. 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 Patient completed 12 cycles of adjuvant FOLFOX chemotherapy on January 08, 2014. CT scan in December 2015 did not reveal evidence of recurrence. No further imaging is necessary unless there is suspicion.  Patient's CEA continues to be within normal limits at 0.7.  His most recent colonoscopy on November 01, 2017 did not reveal evidence of recurrence and recommendation was to repeat in 5 years.  Patient is now nearly 5 years from completing his treatments with no evidence of disease, therefore can be discharged from clinic.  I recommended continue routine follow-up with primary care as well as with GI for his colonoscopies.  Please refer patient back if there are any questions or concerns.  2.  Cold neuropathy: Likely secondary to  chemotherapy.  Patient no longer takes gabapentin.   3.  Hypertension: Patient's blood pressure is moderately elevated today.  Continue follow-up and treatment per primary care.   Patient expressed understanding and was in agreement with this plan. He also understands that He can call clinic at any time with any questions, concerns, or complaints.    [name], MD 12/05/18 6:19 AM

## 2018-12-04 ENCOUNTER — Inpatient Hospital Stay: Payer: BLUE CROSS/BLUE SHIELD

## 2018-12-04 ENCOUNTER — Other Ambulatory Visit: Payer: Self-pay | Admitting: *Deleted

## 2018-12-04 ENCOUNTER — Inpatient Hospital Stay: Payer: BLUE CROSS/BLUE SHIELD | Attending: Oncology | Admitting: Oncology

## 2018-12-04 ENCOUNTER — Encounter: Payer: Self-pay | Admitting: Oncology

## 2018-12-04 ENCOUNTER — Other Ambulatory Visit: Payer: Self-pay

## 2018-12-04 VITALS — BP 157/94 | HR 76 | Temp 97.5°F | Resp 18 | Wt 238.5 lb

## 2018-12-04 DIAGNOSIS — G629 Polyneuropathy, unspecified: Secondary | ICD-10-CM | POA: Diagnosis not present

## 2018-12-04 DIAGNOSIS — C189 Malignant neoplasm of colon, unspecified: Secondary | ICD-10-CM

## 2018-12-04 DIAGNOSIS — I10 Essential (primary) hypertension: Secondary | ICD-10-CM | POA: Insufficient documentation

## 2018-12-04 DIAGNOSIS — C187 Malignant neoplasm of sigmoid colon: Secondary | ICD-10-CM

## 2018-12-04 LAB — CBC WITH DIFFERENTIAL/PLATELET
Abs Immature Granulocytes: 0.02 10*3/uL (ref 0.00–0.07)
Basophils Absolute: 0 10*3/uL (ref 0.0–0.1)
Basophils Relative: 1 %
Eosinophils Absolute: 0.1 10*3/uL (ref 0.0–0.5)
Eosinophils Relative: 2 %
HCT: 42.8 % (ref 39.0–52.0)
Hemoglobin: 14.4 g/dL (ref 13.0–17.0)
Immature Granulocytes: 0 %
Lymphocytes Relative: 28 %
Lymphs Abs: 1.6 10*3/uL (ref 0.7–4.0)
MCH: 29.4 pg (ref 26.0–34.0)
MCHC: 33.6 g/dL (ref 30.0–36.0)
MCV: 87.5 fL (ref 80.0–100.0)
Monocytes Absolute: 0.5 10*3/uL (ref 0.1–1.0)
Monocytes Relative: 9 %
Neutro Abs: 3.5 10*3/uL (ref 1.7–7.7)
Neutrophils Relative %: 60 %
Platelets: 174 10*3/uL (ref 150–400)
RBC: 4.89 MIL/uL (ref 4.22–5.81)
RDW: 12.7 % (ref 11.5–15.5)
WBC: 5.9 10*3/uL (ref 4.0–10.5)
nRBC: 0 % (ref 0.0–0.2)

## 2018-12-04 NOTE — Progress Notes (Signed)
Pt in for rescheduled yearly appt.  Denies any concerns today.

## 2018-12-05 LAB — CEA: CEA: 0.7 ng/mL (ref 0.0–4.7)

## 2018-12-25 ENCOUNTER — Ambulatory Visit (INDEPENDENT_AMBULATORY_CARE_PROVIDER_SITE_OTHER): Payer: BLUE CROSS/BLUE SHIELD

## 2018-12-25 ENCOUNTER — Ambulatory Visit
Admission: EM | Admit: 2018-12-25 | Discharge: 2018-12-25 | Disposition: A | Discharge status: Home / Self Care | Payer: BLUE CROSS/BLUE SHIELD | Attending: Family Medicine | Admitting: Family Medicine

## 2018-12-25 ENCOUNTER — Other Ambulatory Visit: Payer: Self-pay

## 2018-12-25 ENCOUNTER — Encounter: Payer: Self-pay | Admitting: Emergency Medicine

## 2018-12-25 DIAGNOSIS — J189 Pneumonia, unspecified organism: Secondary | ICD-10-CM | POA: Diagnosis not present

## 2018-12-25 DIAGNOSIS — D696 Thrombocytopenia, unspecified: Secondary | ICD-10-CM

## 2018-12-25 DIAGNOSIS — J029 Acute pharyngitis, unspecified: Secondary | ICD-10-CM

## 2018-12-25 DIAGNOSIS — R0602 Shortness of breath: Secondary | ICD-10-CM | POA: Diagnosis not present

## 2018-12-25 DIAGNOSIS — M791 Myalgia, unspecified site: Secondary | ICD-10-CM

## 2018-12-25 DIAGNOSIS — Z7189 Other specified counseling: Secondary | ICD-10-CM | POA: Diagnosis not present

## 2018-12-25 DIAGNOSIS — R509 Fever, unspecified: Secondary | ICD-10-CM

## 2018-12-25 DIAGNOSIS — R05 Cough: Secondary | ICD-10-CM

## 2018-12-25 DIAGNOSIS — Z20828 Contact with and (suspected) exposure to other viral communicable diseases: Secondary | ICD-10-CM | POA: Diagnosis not present

## 2018-12-25 DIAGNOSIS — E878 Other disorders of electrolyte and fluid balance, not elsewhere classified: Secondary | ICD-10-CM

## 2018-12-25 DIAGNOSIS — Z1159 Encounter for screening for other viral diseases: Secondary | ICD-10-CM

## 2018-12-25 DIAGNOSIS — Z20822 Contact with and (suspected) exposure to covid-19: Secondary | ICD-10-CM

## 2018-12-25 LAB — RAPID INFLUENZA A&B ANTIGENS: Influenza A (ARMC): NEGATIVE

## 2018-12-25 LAB — CBC WITH DIFFERENTIAL/PLATELET
Abs Immature Granulocytes: 0.02 10*3/uL (ref 0.00–0.07)
Basophils Absolute: 0 10*3/uL (ref 0.0–0.1)
Basophils Relative: 0 %
Eosinophils Absolute: 0 10*3/uL (ref 0.0–0.5)
Eosinophils Relative: 0 %
HCT: 43.3 % (ref 39.0–52.0)
Hemoglobin: 14.5 g/dL (ref 13.0–17.0)
Immature Granulocytes: 1 %
Lymphocytes Relative: 20 %
Lymphs Abs: 0.8 10*3/uL (ref 0.7–4.0)
MCH: 28.9 pg (ref 26.0–34.0)
MCHC: 33.5 g/dL (ref 30.0–36.0)
MCV: 86.4 fL (ref 80.0–100.0)
Monocytes Absolute: 0.4 10*3/uL (ref 0.1–1.0)
Monocytes Relative: 9 %
Neutro Abs: 2.9 10*3/uL (ref 1.7–7.7)
Neutrophils Relative %: 70 %
Platelets: 128 10*3/uL — ABNORMAL LOW (ref 150–400)
RBC: 5.01 MIL/uL (ref 4.22–5.81)
RDW: 13 % (ref 11.5–15.5)
WBC: 4.1 10*3/uL (ref 4.0–10.5)
nRBC: 0 % (ref 0.0–0.2)

## 2018-12-25 LAB — COMPREHENSIVE METABOLIC PANEL
ALT: 18 U/L (ref 0–44)
AST: 26 U/L (ref 15–41)
Albumin: 4.1 g/dL (ref 3.5–5.0)
Alkaline Phosphatase: 50 U/L (ref 38–126)
Anion gap: 12 (ref 5–15)
BUN: 20 mg/dL (ref 8–23)
CO2: 25 mmol/L (ref 22–32)
Calcium: 8.6 mg/dL — ABNORMAL LOW (ref 8.9–10.3)
Chloride: 100 mmol/L (ref 98–111)
Creatinine, Ser: 0.89 mg/dL (ref 0.61–1.24)
GFR calc Af Amer: >60 mL/min (ref >60)
GFR calc non Af Amer: >60 mL/min (ref >60)
Glucose, Bld: 82 mg/dL (ref 70–99)
Potassium: 3.4 mmol/L — ABNORMAL LOW (ref 3.5–5.1)
Sodium: 137 mmol/L (ref 135–145)
Total Bilirubin: 0.6 mg/dL (ref 0.3–1.2)
Total Protein: 7.5 g/dL (ref 6.5–8.1)

## 2018-12-25 LAB — RAPID INFLUENZA A&B ANTIGENS (ARMC ONLY): Influenza B (ARMC): NEGATIVE

## 2018-12-25 MED ORDER — AZITHROMYCIN 250 MG PO TABS: 250.0000 mg | ORAL_TABLET | Freq: Every day | ORAL | 0 refills | Status: DC

## 2018-12-25 MED ORDER — AMOXICILLIN-POT CLAVULANATE ER 1000-62.5 MG PO TB12: 2.0000 | ORAL_TABLET | Freq: Two times a day (BID) | ORAL | 0 refills | Status: AC

## 2018-12-25 MED ORDER — BENZONATATE 200 MG PO CAPS: 200.0000 mg | ORAL_CAPSULE | Freq: Three times a day (TID) | ORAL | 0 refills | Status: DC | PRN

## 2018-12-25 NOTE — ED Provider Notes (Signed)
[place], [place]   Name: Paul Irwin DOB: 06-15-1955 MRN: [id number] CSN: [id number] PCP: [name], MD  Arrival date and time:  12/25/18 1126  Chief Complaint:  Cough, Headache, and Fever   NOTE: Prior to seeing the patient today, I have reviewed the triage nursing documentation and vital signs. Clinical staff has updated patient's PMH/PSHx, current medication list, and drug allergies/intolerances to ensure comprehensive history available to assist in medical decision making.   History:   HPI: Paul Irwin is a 63 y.o. male who presents today with complaints of a 3-4 day history cough, ear pain, sore throat, and generalized body aches. He has been running fevers at home that have been as high as 100.5. Patient presents today febrile with a documented temperature of 101.2.  He denies any headaches. He has not experienced any nausea, vomiting, diarrhea, or abdominal pain. He is eating and drinking well. Patient denies any perceived alterations to his sense of taste or smell. In efforts to conservatively manage his symptoms at home, the patient notes that he has used Nyquil and Sambucol, neither of which have helped to improve his symptoms. Patient presents today with concerns related to SARS-CoV-2 (novel coronavirus), as his in-laws both tested positive for the virus last week. Father-in-law is currently admitted at [facility]. Mother-in-law is at home and doing well. Patient states, "I haven't been around them, but my wife and daughter have been. I am not really worried that they gave it to be because I was starting to feel bad  before they tested positive". Wife and daughter have both tested negative at this point. Patient reports that he was swabbed for SARS-CoV-2 this morning at [facility] in [place].   Past Medical History:  Diagnosis Date   Atrial fibrillation ([place])    GERD (gastroesophageal reflux disease)    Kidney stones    Malignant neoplasm of sigmoid colon ([place]) 2015     T3,N1a. Moderately well differentiated.  1 / 13 nodes positive; adjuvant chemotherapy.    Sleep apnea     Past Surgical History:  Procedure Laterality Date   COLON SURGERY  06/20/2013   Sigmoid colectomy. [id number]. Moderately well differentiated.  1/13 nodes positive   colonoscopy  06/10/13   Dr. Vira Agar   COLONOSCOPY N/A 09/09/2014   Procedure: COLONOSCOPY;  Surgeon: Robert Bellow, MD;  Location: [facility] ENDOSCOPY;  Service: Endoscopy;  Laterality: N/A;   COLONOSCOPY WITH PROPOFOL N/A 11/01/2017   Procedure: COLONOSCOPY WITH PROPOFOL;  Surgeon: Robert Bellow, MD;  Location: [facility] ENDOSCOPY;  Service: Endoscopy;  Laterality: N/A;   SIGMOIDOSCOPY  06/20/13   WRIST SURGERY Right 2001    Family History  Problem Relation Age of Onset   Diverticulitis Mother    Heart disease Father    Diabetes Father    Colon cancer Neg Hx     Social History   Tobacco Use   Smoking status: Never Smoker   Smokeless tobacco: Never Used  Substance Use Topics   Alcohol use: No   Drug use: No    Patient Active Problem List   Diagnosis Date Noted   History of colon cancer 10/11/2017   Cancer of sigmoid ([place]) 06/12/2013   Personal history of colonic polyps 06/12/2013    Home Medications:    Current Meds  Medication Sig   gabapentin (NEURONTIN) 100 MG capsule Take 1 capsule (100 mg total) by mouth 2 (two) times daily.    Allergies:   Patient has no known allergies.  Review of  Systems (ROS): Review of Systems  Constitutional: Positive for fever (Tmax 101.2). Negative for fatigue.  HENT: Positive for ear pain and sore throat. Negative for congestion, postnasal drip, rhinorrhea, sinus pressure, sinus pain and sneezing.   Eyes: Negative for pain, discharge and redness.  Respiratory: Positive for cough and shortness of breath. Negative for chest tightness.   Cardiovascular: Negative for chest pain and palpitations.  Gastrointestinal: Negative for abdominal pain, diarrhea,  nausea and vomiting.  Musculoskeletal: Positive for myalgias. Negative for arthralgias, back pain and neck pain.  Skin: Negative for color change, pallor and rash.  Neurological: Negative for dizziness, syncope, weakness and headaches.  Hematological: Negative for adenopathy.     Vital Signs: Today's Vitals   12/25/18 1145 12/25/18 1146 12/25/18 1345  BP: (!) 149/72    Pulse: (!) 105    Resp: 18    Temp: (!) 101.2 F (38.4 C)    TempSrc: Oral    SpO2: 98%    Weight:  237 lb (107.5 kg)   Height:  5\' 6"  (1.676 m)   PainSc: 3   3     Physical Exam: Physical Exam  Constitutional: He is oriented to person, place, and time and well-developed, well-nourished, and in no distress.  Non-toxic appearance. No distress.  Acute ill appearing; fatigued/listless.  HENT:  Head: Normocephalic and atraumatic.  Right Ear: No tenderness. Tympanic membrane is injected. Tympanic membrane is not bulging. No middle ear effusion.  Left Ear: No tenderness. Tympanic membrane is injected. Tympanic membrane is not bulging.  No middle ear effusion.  Nose: Mucosal edema present. No rhinorrhea or sinus tenderness.  Mouth/Throat: Uvula is midline and mucous membranes are normal. Posterior oropharyngeal erythema present. No posterior oropharyngeal edema.  Eyes: Pupils are equal, round, and reactive to light. Conjunctivae and EOM are normal.  Neck: Normal range of motion. Neck supple.  Cardiovascular: Regular rhythm, normal heart sounds and intact distal pulses. Tachycardia present. Exam reveals no gallop and no friction rub.  No murmur heard. Pulmonary/Chest: Effort normal. No respiratory distress. He has decreased breath sounds in the right lower field. He has no wheezes. He has rhonchi in the right upper field, the right middle field, the right lower field and the left upper field. He has no rales.  Abdominal: Soft. Normal appearance and bowel sounds are normal. He exhibits no distension. There is no abdominal  tenderness.  Musculoskeletal: Normal range of motion.  Neurological: He is alert and oriented to person, place, and time. Gait normal.  Skin: Skin is warm and dry. No rash noted. He is not diaphoretic.  Psychiatric: Mood, memory, affect and judgment normal.  Nursing note and vitals reviewed.   Urgent Care Treatments / Results:   LABS: PLEASE NOTE: all labs that were ordered this encounter are listed, however only abnormal results are displayed. Labs Reviewed  COMPREHENSIVE METABOLIC PANEL - Abnormal; Notable for the following components:      Result Value   Potassium 3.4 (*)    Calcium 8.6 (*)    All other components within normal limits  CBC WITH DIFFERENTIAL/PLATELET - Abnormal; Notable for the following components:   Platelets 128 (*)    All other components within normal limits  RAPID INFLUENZA A&B ANTIGENS ([facility] ONLY)    EKG: -None  RADIOLOGY: Dg Chest 2 View  Result Date: 12/25/2018 CLINICAL DATA:  Cough, fever. EXAM: CHEST - 2 VIEW COMPARISON:  None. FINDINGS: The heart size and mediastinal contours are within normal limits. No pneumothorax or pleural effusion is  noted. Multiple peripheral airspace opacities are noted in the right lung with small opacity seen in left upper lobe; these are most consistent with multifocal pneumonia. The visualized skeletal structures are unremarkable. IMPRESSION: Multiple right lung opacities are noted with possible left upper lobe opacity most consistent with multifocal pneumonia, potentially of atypical or viral etiology. Electronically Signed   By: Marijo Conception M.D.   On: 12/25/2018 12:49    PROCEDURES: Procedures  MEDICATIONS RECEIVED THIS VISIT: Medications - No data to display  PERTINENT CLINICAL COURSE NOTES/UPDATES:   Initial Impression / Assessment and Plan / Urgent Care Course:  Pertinent labs & imaging results that were available during my care of the patient were personally reviewed by me and considered in my medical  decision making (see lab/imaging section of note for values and interpretations).  Paul Irwin is a 63 y.o. male who presents to [facility] today with complaints of Cough, Headache, and Fever   Patient acutely ill appearing (non-toxic) [place] clinic today. He appears fatigued/listless. Presenting symptoms (see HPI) and exam as documented above. He presents with symptoms concerning for SARS-CoV-2 (novel coronavirus). Patient tested for SARS-CoV-2 earlier today at another facility [facility]), however decided to present to [facility] in order to "appease his family". His in-laws have both have tested (+) for the virus; father-in-law is hospitalized. Wife and daughter have been tested and are both negative. Workup in clinic as follows:   Rapid influenza diagnotic test (RIDT) resulted (-) for both the influenza A and B Ag.    CBC reveals a normal WBC of 4100. H&H normal. Platelets low at 128,000. K+ 3.4 mmol/L. Calcium 8.6 mg/dL. BUN 20 and creatinine 0.89. Estimated Creatinine Clearance: 99 mL/min (by C-G formula based on SCr of 0.89 mg/dL).    Radiographs of the chest revealed multiple opacities in the RIGHT lung and a small opacity in his LEFT upper lung consistent with multifocal pneumonia. Etiology atypical versus viral PNA.   Exam reveals patient who is CAO x 4 with even and non-labored respirations. SPO2 98% on RA. There are no signs of apparent acute distress. Discussed  findings from CXR in detail with patient. Given the appearance of his chest radiographs, I recommended that patient present to the emergency department for further testing and possible admission CAP. Advised that unable to determine atypical bacterial versus viral etiology, however given the results of his labs today, a viral pneumonia is suspected, with SARS-CoV-2 being a strong possibility. Patient has several co-morbid factors that stand to potentially complicate his current CAP course; A.fib, OSAH, obesity, history of  colon cancer. Despite our conversation, patient advising that he would like to defer being seen in the emergency department and/or being admitted to the hospital at this time. Patient states, "I follow instructions really well. I will do what you say, but I will do it at home. My insurance is not the best and I just can't go to the hospital right now". I discussed my concerns with him, however it remained adamant that his wishes were to discharge home today. Patient states, "if I get worse I will go to the hospital then". Patient aware that this constitutes an [place] course, and therefore accepts the associated risks of managing his current illness at home. In efforts to prevent associated complications and cover for a potential atypical bacterial etiology, will cover patient with high dose Augmentin XR for 10 days, in addition to a 5 day course of azithromycin. Discussed supportive care measures at home during  acute phase of illness. Patient to rest as much as possible. He was encouraged to ensure adequate hydration (water and ORS) to prevent dehydration and electrolyte derangements. Patient may use APAP and/or IBU on an as needed basis for pain/fever. Supply of benzonatate sent in for as needed use to help with patient's cough.   Discussed follow up with primary care physician in 1 week for re-evaluation, or sooner if he worsens. I have reviewed the follow up and strict return precautions for any new or worsening symptoms. Again, patient understands and accepts the risks of possible rapid deterioration in his condition. He is aware of symptoms that would be deemed urgent/emergent, and would thus require further evaluation in the emergency department. At the time of discharge, he verbalized understanding and consent with the discharge plan as it was reviewed with him. All questions were fielded by provider and/or clinic staff prior to patient discharge.    Final Clinical Impressions / Urgent Care Diagnoses:    Final diagnoses:  Community acquired pneumonia, unspecified laterality  Suspected COVID-19 virus infection  Advice given about COVID-19 virus infection  Electrolyte abnormality  Thrombocytopenia ([place])  Encounter for screening for other viral diseases  Fever, unspecified    New Prescriptions:  [place] Controlled Substance Registry consulted? Not Applicable  Meds ordered this encounter  Medications   amoxicillin-clavulanate (AUGMENTIN XR) 1000-62.5 MG 12 hr tablet    Sig: Take 2 tablets by mouth 2 (two) times daily for 10 days.    Dispense:  40 tablet    Refill:  0   azithromycin (ZITHROMAX) 250 MG tablet    Sig: Take 1 tablet (250 mg total) by mouth daily. Take first 2 tablets together, then 1 every day until finished.    Dispense:  6 tablet    Refill:  0   benzonatate (TESSALON) 200 MG capsule    Sig: Take 1 capsule (200 mg total) by mouth 3 (three) times daily as needed for cough.    Dispense:  21 capsule    Refill:  0    Recommended Follow up Care:  Patient encouraged to follow up with the following provider within the specified time frame, or sooner as dictated by the severity of his symptoms. As always, he was instructed that for any urgent/emergent care needs, he should seek care either here or in the emergency department for more immediate evaluation.  Follow-up Information    [name], MD In 1 week.   Specialty: Internal Medicine Why: General reassessment of symptoms if not improving Contact information: [place] [id number] [phone]         NOTE: This note was prepared using Dragon dictation software along with smaller phrase technology. Despite my best ability to proofread, there is the potential that transcriptional errors may still occur from this process, and are completely unintentional.    Karen Kitchens, NP 12/25/18 2344

## 2018-12-25 NOTE — Discharge Instructions (Signed)
It was very nice seeing you today in clinic. Thank you for entrusting me with your care.   Rest and Increase fluid intake as much as possible. Water is always best, as sugar and caffeine containing fluids can cause you to become dehydrated. Try to incorporate electrolyte enriched fluids, such as Gatorade or Pedialyte, into your daily fluid intake.  You mentioned that your goal was not to go to the hospital. Your labs look ok. Unsure if this is an atypical bacterial pneumonia, of if it is viral. I will cover you for bacterial etiology. If you are not improving, or become worse to any degree, please proceed to the emergency department for further evaluation.   Make arrangements to follow up with your regular doctor in 1 week for re-evaluation if not improving. If your symptoms/condition worsens, please seek follow up care either here or in the ER. Please remember, our [place] providers are "right here with you" when you need [place].   Again, it was my pleasure to take care of you today. Thank you for choosing our clinic. I hope that you start to feel better quickly.   Honor Loh, MSN, APRN, FNP-C, CEN Advanced Practice Provider [facility]

## 2018-12-25 NOTE — ED Triage Notes (Addendum)
Patient in today c/o cough, headache and fever (100.5) x 3-4 days. Patient had a COVID test done this morning at [facility] in [place]. Patient's in-laws are positive for COVID. Patient wife and daughter have tested negative.

## 2023-08-18 ENCOUNTER — Encounter: Payer: Self-pay | Admitting: *Deleted

## 2023-09-08 ENCOUNTER — Encounter
Admission: RE | Disposition: A | Discharge status: Home / Self Care | Payer: Self-pay | Source: Ambulatory Visit | Attending: Gastroenterology

## 2023-09-08 ENCOUNTER — Other Ambulatory Visit: Payer: Self-pay

## 2023-09-08 ENCOUNTER — Ambulatory Visit
Admission: RE | Admit: 2023-09-08 | Discharge: 2023-09-08 | Disposition: A | Discharge status: Home / Self Care | Source: Ambulatory Visit | Attending: Gastroenterology | Admitting: Gastroenterology

## 2023-09-08 ENCOUNTER — Ambulatory Visit: Admitting: Anesthesiology

## 2023-09-08 DIAGNOSIS — D124 Benign neoplasm of descending colon: Secondary | ICD-10-CM | POA: Insufficient documentation

## 2023-09-08 DIAGNOSIS — K219 Gastro-esophageal reflux disease without esophagitis: Secondary | ICD-10-CM | POA: Diagnosis not present

## 2023-09-08 DIAGNOSIS — G4730 Sleep apnea, unspecified: Secondary | ICD-10-CM | POA: Insufficient documentation

## 2023-09-08 DIAGNOSIS — D123 Benign neoplasm of transverse colon: Secondary | ICD-10-CM | POA: Insufficient documentation

## 2023-09-08 DIAGNOSIS — Z9049 Acquired absence of other specified parts of digestive tract: Secondary | ICD-10-CM | POA: Insufficient documentation

## 2023-09-08 DIAGNOSIS — Z79899 Other long term (current) drug therapy: Secondary | ICD-10-CM | POA: Insufficient documentation

## 2023-09-08 DIAGNOSIS — K640 First degree hemorrhoids: Secondary | ICD-10-CM | POA: Insufficient documentation

## 2023-09-08 DIAGNOSIS — Z7901 Long term (current) use of anticoagulants: Secondary | ICD-10-CM | POA: Diagnosis not present

## 2023-09-08 DIAGNOSIS — Z1211 Encounter for screening for malignant neoplasm of colon: Secondary | ICD-10-CM | POA: Insufficient documentation

## 2023-09-08 DIAGNOSIS — I10 Essential (primary) hypertension: Secondary | ICD-10-CM | POA: Diagnosis not present

## 2023-09-08 HISTORY — DX: Personal history of other malignant neoplasm of large intestine: Z85.038

## 2023-09-08 HISTORY — DX: Adverse effect of antineoplastic and immunosuppressive drugs, initial encounter: G62.0

## 2023-09-08 HISTORY — DX: Drug-induced polyneuropathy: T45.1X5A

## 2023-09-08 HISTORY — PX: POLYPECTOMY: SHX149

## 2023-09-08 HISTORY — DX: Male erectile dysfunction, unspecified: N52.9

## 2023-09-08 HISTORY — PX: COLONOSCOPY: SHX5424

## 2023-09-08 HISTORY — DX: Essential (primary) hypertension: I10

## 2023-09-08 HISTORY — DX: Prediabetes: R73.03

## 2023-09-08 SURGERY — COLONOSCOPY
Anesthesia: General

## 2023-09-08 MED ORDER — PROPOFOL 500 MG/50ML IV EMUL
INTRAVENOUS | Status: DC | PRN
  Administered 2023-09-08: 140 ug/kg/min via INTRAVENOUS

## 2023-09-08 MED ORDER — SODIUM CHLORIDE 0.9 % IV SOLN: INTRAVENOUS | Status: DC

## 2023-09-08 MED ORDER — PROPOFOL 10 MG/ML IV BOLUS
INTRAVENOUS | Status: DC | PRN
  Administered 2023-09-08: 70 mg via INTRAVENOUS

## 2023-09-08 NOTE — Interval H&P Note (Signed)
 History and Physical Interval Note:  09/08/2023 9:25 AM  Paul Irwin  has presented today for surgery, with the diagnosis of History of colon cancer (Z85.038).  The various methods of treatment have been discussed with the patient and family. After consideration of risks, benefits and other options for treatment, the patient has consented to  Procedure(s): COLONOSCOPY (N/A) as a surgical intervention.  The patient's history has been reviewed, patient examined, no change in status, stable for surgery.  I have reviewed the patient's chart and labs.  Questions were answered to the patient's satisfaction.     Paul Irwin  Ok to proceed with colonoscopy

## 2023-09-08 NOTE — H&P (Signed)
 Outpatient short stay form Pre-procedure 09/08/2023  Paul ONEIDA Schick, MD  Primary Physician: Lenon Layman ORN, MD  Reason for visit:  Surveillance  History of present illness:    68 y/o gentleman with history of hypertension and colon cancer s/p left hemicolectomy here for surveillance. Last dose of eliquis was 5 days ago. No other significant abdominal surgeries.    Current Facility-Administered Medications:    0.9 %  sodium chloride  infusion, , Intravenous, Continuous, Sofi Bryars, Paul ONEIDA, MD, Last Rate: 20 mL/hr at 09/08/23 0909, New Bag at 09/08/23 0909  Medications Prior to Admission  Medication Sig Dispense Refill Last Dose/Taking   apixaban (ELIQUIS) 5 MG TABS tablet Take 5 mg by mouth 2 (two) times daily.   Past Week   azithromycin  (ZITHROMAX ) 250 MG tablet Take 1 tablet (250 mg total) by mouth daily. Take first 2 tablets together, then 1 every day until finished. 6 tablet 0 09/07/2023   losartan-hydrochlorothiazide (HYZAAR) 50-12.5 MG tablet Take 1 tablet by mouth daily.   Past Week   rosuvastatin (CRESTOR) 5 MG tablet Take 5 mg by mouth daily.   Past Week   benzonatate  (TESSALON ) 200 MG capsule Take 1 capsule (200 mg total) by mouth 3 (three) times daily as needed for cough. 21 capsule 0    gabapentin  (NEURONTIN ) 100 MG capsule Take 1 capsule (100 mg total) by mouth 2 (two) times daily. 180 capsule 1      No Known Allergies   Past Medical History:  Diagnosis Date   Atrial fibrillation [facility])    ED (erectile dysfunction) of organic origin    GERD (gastroesophageal reflux disease)    History of colon cancer    Hypertension    Kidney stones    Malignant neoplasm of sigmoid colon ([facility]) 2015   T3,N1a. Moderately well differentiated.  1 / 13 nodes positive; adjuvant chemotherapy.    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    Physical Exam  Gen: Alert, oriented. Appears stated  age.  HEENT: PERRLA. Lungs: No respiratory distress CV: RRR Abd: soft, benign, no masses Ext: No edema    Planned procedures: Proceed with colonoscopy. The patient understands the nature of the planned procedure, indications, risks, alternatives and potential complications including but not limited to bleeding, infection, perforation, damage to internal organs and possible oversedation/side effects from anesthesia. The patient agrees and gives consent to proceed.  Please refer to procedure notes for findings, recommendations and patient disposition/instructions.     Paul ONEIDA Schick, MD [facility]

## 2023-09-08 NOTE — Op Note (Signed)
 [facility] Patient Name: Paul Irwin Procedure Date: 09/08/2023 9:23 AM MRN: [id number] Account #: [id number] Date of Birth: [birth year] Admit Type: Outpatient Age: 68 Room: [facility] ENDO ROOM 3 Gender: Male Note Status: Finalized Instrument Name: [id number] Procedure:             Colonoscopy Indications:           High risk colon cancer surveillance: Personal history                         of colon cancer Providers:             Ole Schick MD, MD Referring MD:          Layman ORN. Lenon MD, MD (Referring MD) Medicines:             Monitored Anesthesia Care Complications:         No immediate complications. Estimated blood loss:                         Minimal. Procedure:             Pre-Anesthesia Assessment:                        - Prior to the procedure, a History and Physical was                         performed, and patient medications and allergies were                         reviewed. The patient is competent. The risks and                         benefits of the procedure and the sedation options and                         risks were discussed with the patient. All questions                         were answered and informed consent was obtained.                         Patient identification and proposed procedure were                         verified by the physician, the nurse, the                         anesthesiologist, the anesthetist and the technician                         in the endoscopy suite. Mental Status Examination:                         alert and oriented. Airway Examination: normal                         oropharyngeal airway and neck mobility. Respiratory  Examination: clear to auscultation. CV Examination:                         normal. Prophylactic Antibiotics: The patient does not                         require prophylactic antibiotics. Prior                          Anticoagulants: The patient has taken no anticoagulant                         or antiplatelet agents. ASA Grade Assessment: III - A                         patient with severe systemic disease. After reviewing                         the risks and benefits, the patient was deemed in                         satisfactory condition to undergo the procedure. The                         anesthesia plan was to use monitored anesthesia care                         (MAC). Immediately prior to administration of                         medications, the patient was re-assessed for adequacy                         to receive sedatives. The heart rate, respiratory                         rate, oxygen saturations, blood pressure, adequacy of                         pulmonary ventilation, and response to care were                         monitored throughout the procedure. The physical                         status of the patient was re-assessed after the                         procedure.                        After obtaining informed consent, the colonoscope was                         passed under direct vision. Throughout the procedure,                         the patient's blood pressure, pulse, and oxygen  saturations were monitored continuously. The                         Colonoscope was introduced through the anus and                         advanced to the the terminal ileum, with                         identification of the appendiceal orifice and IC                         valve. The colonoscopy was performed without                         difficulty. The patient tolerated the procedure well.                         The quality of the bowel preparation was good. The                         terminal ileum, ileocecal valve, appendiceal orifice,                         and rectum were photographed. Findings:      The perianal and digital rectal examinations were  normal.      A 4 mm polyp was found in the descending colon. The polyp was sessile.       The polyp was removed with a cold snare. Resection and retrieval were       complete. Estimated blood loss was minimal.      Non-bleeding internal hemorrhoids were found during retroflexion. The       hemorrhoids were Grade I (internal hemorrhoids that do not prolapse).      The exam was otherwise without abnormality on direct and retroflexion       views. Impression:            - One 4 mm polyp in the descending colon, removed with                         a cold snare. Resected and retrieved.                        - Non-bleeding internal hemorrhoids.                        - The examination was otherwise normal on direct and                         retroflexion views. Recommendation:        - Discharge patient to home.                        - Resume previous diet.                        - Continue present medications.                        - Await pathology results.                        -  Repeat colonoscopy in 5 years for surveillance.                        - Return to referring physician as previously                         scheduled. Procedure Code(s):     --- Professional ---                        [phone], Colonoscopy, flexible; with removal of                         tumor(s), polyp(s), or other lesion(s) by snare                         technique Diagnosis Code(s):     --- Professional ---                        S14.961, Personal history of other malignant neoplasm                         of large intestine                        D12.4, Benign neoplasm of descending colon                        K64.0, First degree hemorrhoids CPT copyright 2022 [employer]. All rights reserved. The codes documented in this report are preliminary and upon coder review may  be revised to meet current compliance requirements. Ole Schick MD, MD 09/08/2023 9:54:34 AM Number of  Addenda: 0 Note Initiated On: 09/08/2023 9:23 AM Scope Withdrawal Time: 0 hours 8 minutes 19 seconds  Total Procedure Duration: 0 hours 13 minutes 8 seconds  Estimated Blood Loss:  Estimated blood loss was minimal.      [facility]

## 2023-09-08 NOTE — Transfer of Care (Signed)
 Immediate Anesthesia Transfer of Care Note  Patient: Paul Irwin  Procedure(s) Performed: COLONOSCOPY POLYPECTOMY, INTESTINE  Patient Location: PACU  Anesthesia Type:General  Level of Consciousness: awake, alert , and oriented  Airway & Oxygen Therapy: Patient Spontanous Breathing  Post-op Assessment: Report given to RN and Post -op Vital signs reviewed and stable  Post vital signs: Reviewed and stable  Last Vitals:  Vitals Value Taken Time  BP    Temp    Pulse 87 09/08/23 09:50  Resp 16 09/08/23 09:50  SpO2 94 % 09/08/23 09:50  Vitals shown include unfiled device data.  Last Pain:  Vitals:   09/08/23 0948  TempSrc: Temporal  PainSc: Asleep         Complications: No notable events documented.

## 2023-09-08 NOTE — Anesthesia Preprocedure Evaluation (Addendum)
 Anesthesia Evaluation  Patient identified by MRN, date of birth, ID band Patient awake    Reviewed: Allergy & Precautions, NPO status , Patient's Chart, lab work & pertinent test results  Airway Mallampati: III  TM Distance: >3 FB Neck ROM: full    Dental  (+) Teeth Intact   Pulmonary neg pulmonary ROS, sleep apnea and Continuous Positive Airway Pressure Ventilation    Pulmonary exam normal        Cardiovascular Exercise Tolerance: Good hypertension, Pt. on medications negative cardio ROS Normal cardiovascular exam Rhythm:Regular Rate:Normal     Neuro/Psych negative neurological ROS  negative psych ROS   GI/Hepatic negative GI ROS, Neg liver ROS,GERD  Medicated,,  Endo/Other  negative endocrine ROS  Class 3 obesity  Renal/GU negative Renal ROS  negative genitourinary   Musculoskeletal negative musculoskeletal ROS (+)    Abdominal  (+) + obese  Peds negative pediatric ROS (+)  Hematology negative hematology ROS (+)   Anesthesia Other Findings Past Medical History: No date: Atrial fibrillation (HCC) No date: ED (erectile dysfunction) of organic origin No date: GERD (gastroesophageal reflux disease) No date: History of colon cancer No date: Hypertension No date: Kidney stones 2015: 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 Moderately well differentiated.  1 / 13 nodes               positive; adjuvant chemotherapy.  No date: Neuropathy due to chemotherapeutic drug (HCC) No date: Pre-diabetes No date: Sleep apnea  Past Surgical History: 06/20/2013: COLON SURGERY     Comment:  Sigmoid colectomy. U6,W8j. Moderately well               differentiated.  1/13 nodes positive 06/10/2013: colonoscopy     Comment:  Dr. Viktoria 09/09/2014: COLONOSCOPY; N/A     Comment:  Procedure: COLONOSCOPY;  Surgeon: Reyes LELON Cota, MD;              Location: [facility] ENDOSCOPY;  Service: Endoscopy;                 Laterality: N/A; 11/01/2017: COLONOSCOPY WITH PROPOFOL ; N/A     Comment:  Procedure: COLONOSCOPY WITH PROPOFOL ;  Surgeon: Cota Reyes LELON, MD;  Location: [facility] ENDOSCOPY;  Service:               Endoscopy;  Laterality: N/A; No date: FRACTURE SURGERY No date: LITHOTRIPSY; N/A 06/20/2013: SIGMOIDOSCOPY 2001: WRIST SURGERY; Right     Reproductive/Obstetrics negative OB ROS                              Anesthesia Physical Anesthesia Plan  ASA: 3  Anesthesia Plan: General   Post-op Pain Management:    Induction: Intravenous  PONV Risk Score and Plan: Propofol  infusion and TIVA  Airway Management Planned: Natural Airway and Nasal Cannula  Additional Equipment:   Intra-op Plan:   Post-operative Plan:   Informed Consent: I have reviewed the patients History and Physical, chart, labs and discussed the procedure including the risks, benefits and alternatives for the proposed anesthesia with the patient or authorized representative who has indicated his/her understanding and acceptance.     Dental Advisory Given  Plan Discussed with: CRNA  Anesthesia Plan Comments:         Anesthesia Quick Evaluation

## 2023-09-08 NOTE — Anesthesia Postprocedure Evaluation (Signed)
 Anesthesia Post Note  Patient: Paul Irwin  Procedure(s) Performed: COLONOSCOPY POLYPECTOMY, INTESTINE  Patient location during evaluation: PACU Anesthesia Type: General Level of consciousness: awake and awake and alert Pain management: satisfactory to patient Vital Signs Assessment: post-procedure vital signs reviewed and stable Respiratory status: spontaneous breathing Cardiovascular status: stable Anesthetic complications: no   No notable events documented.   Last Vitals:  Vitals:   09/08/23 1005 09/08/23 1010  BP:  (!) 126/91  Pulse: 83 77  Resp: 18 19  Temp:  (!) 35.7 C  SpO2: 98% 100%    Last Pain:  Vitals:   09/08/23 1010  TempSrc: Temporal  PainSc: 0-No pain                 VAN STAVEREN,Randie Tallarico

## 2023-09-11 LAB — SURGICAL PATHOLOGY

## 2024-01-18 ENCOUNTER — Other Ambulatory Visit: Payer: Self-pay | Admitting: Physician Assistant

## 2024-01-18 DIAGNOSIS — I10 Essential (primary) hypertension: Secondary | ICD-10-CM

## 2024-01-18 DIAGNOSIS — I4819 Other persistent atrial fibrillation: Secondary | ICD-10-CM

## 2024-01-18 DIAGNOSIS — G4733 Obstructive sleep apnea (adult) (pediatric): Secondary | ICD-10-CM

## 2024-01-18 DIAGNOSIS — R9439 Abnormal result of other cardiovascular function study: Secondary | ICD-10-CM

## 2024-02-15 ENCOUNTER — Ambulatory Visit

## 2024-02-15 ENCOUNTER — Telehealth (HOSPITAL_COMMUNITY): Payer: Self-pay | Admitting: *Deleted

## 2024-02-15 NOTE — Telephone Encounter (Signed)
 Reaching out to patient to offer assistance regarding upcoming cardiac imaging study; pt verbalizes understanding of appt date/time, parking situation and where to check in, pre-test NPO status and medications ordered, and verified current allergies; name and call back number provided for further questions should they arise Sid Seats RN Navigator Cardiac Imaging [facility] [phone] office [phone] cell

## 2024-02-16 ENCOUNTER — Ambulatory Visit (HOSPITAL_COMMUNITY)
Admission: RE | Admit: 2024-02-16 | Discharge: 2024-02-16 | Disposition: A | Discharge status: Home / Self Care | Source: Ambulatory Visit | Attending: Cardiovascular Disease | Admitting: Cardiovascular Disease

## 2024-02-16 ENCOUNTER — Ambulatory Visit (HOSPITAL_BASED_OUTPATIENT_CLINIC_OR_DEPARTMENT_OTHER)
Admission: RE | Admit: 2024-02-16 | Discharge: 2024-02-16 | Disposition: A | Source: Ambulatory Visit | Attending: Cardiovascular Disease | Admitting: Cardiovascular Disease

## 2024-02-16 ENCOUNTER — Other Ambulatory Visit: Payer: Self-pay | Admitting: Cardiovascular Disease

## 2024-02-16 DIAGNOSIS — G4733 Obstructive sleep apnea (adult) (pediatric): Secondary | ICD-10-CM | POA: Diagnosis not present

## 2024-02-16 DIAGNOSIS — I10 Essential (primary) hypertension: Secondary | ICD-10-CM | POA: Diagnosis not present

## 2024-02-16 DIAGNOSIS — R931 Abnormal findings on diagnostic imaging of heart and coronary circulation: Secondary | ICD-10-CM | POA: Insufficient documentation

## 2024-02-16 DIAGNOSIS — R9439 Abnormal result of other cardiovascular function study: Secondary | ICD-10-CM | POA: Insufficient documentation

## 2024-02-16 DIAGNOSIS — I4819 Other persistent atrial fibrillation: Secondary | ICD-10-CM | POA: Insufficient documentation

## 2024-02-16 MED ORDER — NITROGLYCERIN 0.4 MG SL SUBL
0.8000 mg | SUBLINGUAL_TABLET | Freq: Once | SUBLINGUAL | Status: AC
  Administered 2024-02-16: 0.8 mg via SUBLINGUAL

## 2024-02-16 MED ORDER — IOHEXOL 350 MG/ML SOLN
100.0000 mL | Freq: Once | INTRAVENOUS | Status: AC | PRN
  Administered 2024-02-16: 100 mL via INTRAVENOUS

## 2024-03-05 ENCOUNTER — Ambulatory Visit
Admission: RE | Admit: 2024-03-05 | Discharge: 2024-03-05 | Disposition: A | Discharge status: Home / Self Care | Attending: Cardiology | Admitting: Cardiology

## 2024-03-05 ENCOUNTER — Encounter: Payer: Self-pay | Admitting: Cardiology

## 2024-03-05 ENCOUNTER — Other Ambulatory Visit: Payer: Self-pay

## 2024-03-05 ENCOUNTER — Encounter: Admission: RE | Disposition: A | Payer: Self-pay | Source: Home / Self Care | Attending: Cardiology

## 2024-03-05 DIAGNOSIS — Z7901 Long term (current) use of anticoagulants: Secondary | ICD-10-CM | POA: Insufficient documentation

## 2024-03-05 DIAGNOSIS — I482 Chronic atrial fibrillation, unspecified: Secondary | ICD-10-CM | POA: Insufficient documentation

## 2024-03-05 DIAGNOSIS — I2582 Chronic total occlusion of coronary artery: Secondary | ICD-10-CM | POA: Diagnosis not present

## 2024-03-05 DIAGNOSIS — E785 Hyperlipidemia, unspecified: Secondary | ICD-10-CM | POA: Insufficient documentation

## 2024-03-05 DIAGNOSIS — I10 Essential (primary) hypertension: Secondary | ICD-10-CM | POA: Diagnosis not present

## 2024-03-05 DIAGNOSIS — R9439 Abnormal result of other cardiovascular function study: Secondary | ICD-10-CM

## 2024-03-05 DIAGNOSIS — I429 Cardiomyopathy, unspecified: Secondary | ICD-10-CM | POA: Insufficient documentation

## 2024-03-05 DIAGNOSIS — G4733 Obstructive sleep apnea (adult) (pediatric): Secondary | ICD-10-CM | POA: Diagnosis not present

## 2024-03-05 DIAGNOSIS — Z79899 Other long term (current) drug therapy: Secondary | ICD-10-CM | POA: Diagnosis not present

## 2024-03-05 DIAGNOSIS — I2510 Atherosclerotic heart disease of native coronary artery without angina pectoris: Secondary | ICD-10-CM | POA: Insufficient documentation

## 2024-03-05 DIAGNOSIS — Z8249 Family history of ischemic heart disease and other diseases of the circulatory system: Secondary | ICD-10-CM | POA: Diagnosis not present

## 2024-03-05 HISTORY — PX: LEFT HEART CATH AND CORONARY ANGIOGRAPHY: CATH118249

## 2024-03-05 SURGERY — LEFT HEART CATH AND CORONARY ANGIOGRAPHY
Anesthesia: Moderate Sedation | Laterality: Left

## 2024-03-05 MED ORDER — SODIUM CHLORIDE 0.9% FLUSH: 3.0000 mL | Freq: Two times a day (BID) | INTRAVENOUS | Status: DC

## 2024-03-05 MED ORDER — FREE WATER
250.0000 mL | Freq: Once | Status: AC
  Administered 2024-03-05: 250 mL via ORAL

## 2024-03-05 MED ORDER — MIDAZOLAM HCL 2 MG/2ML IJ SOLN
INTRAMUSCULAR | Status: AC
  Filled 2024-03-05: qty 2

## 2024-03-05 MED ORDER — HEPARIN (PORCINE) IN NACL 1000-0.9 UT/500ML-% IV SOLN
INTRAVENOUS | Status: DC | PRN
  Administered 2024-03-05: 1000 mL

## 2024-03-05 MED ORDER — HEPARIN SODIUM (PORCINE) 1000 UNIT/ML IJ SOLN
INTRAMUSCULAR | Status: AC
  Filled 2024-03-05: qty 10

## 2024-03-05 MED ORDER — HEPARIN SODIUM (PORCINE) 1000 UNIT/ML IJ SOLN
INTRAMUSCULAR | Status: DC | PRN
  Administered 2024-03-05: 5000 [IU] via INTRAVENOUS

## 2024-03-05 MED ORDER — HEPARIN (PORCINE) IN NACL 1000-0.9 UT/500ML-% IV SOLN
INTRAVENOUS | Status: AC
  Filled 2024-03-05: qty 1000

## 2024-03-05 MED ORDER — FENTANYL CITRATE (PF) 100 MCG/2ML IJ SOLN
INTRAMUSCULAR | Status: AC
  Filled 2024-03-05: qty 2

## 2024-03-05 MED ORDER — VERAPAMIL HCL 2.5 MG/ML IV SOLN
INTRAVENOUS | Status: AC
  Filled 2024-03-05: qty 2

## 2024-03-05 MED ORDER — MIDAZOLAM HCL (PF) 2 MG/2ML IJ SOLN
INTRAMUSCULAR | Status: DC | PRN
  Administered 2024-03-05: 1 mg via INTRAVENOUS

## 2024-03-05 MED ORDER — ASPIRIN 81 MG PO CHEW
CHEWABLE_TABLET | ORAL | Status: AC
  Filled 2024-03-05: qty 1

## 2024-03-05 MED ORDER — IOHEXOL 300 MG/ML  SOLN
INTRAMUSCULAR | Status: DC | PRN
  Administered 2024-03-05: 92 mL

## 2024-03-05 MED ORDER — LIDOCAINE HCL (PF) 1 % IJ SOLN
INTRAMUSCULAR | Status: DC | PRN
  Administered 2024-03-05: 5 mL

## 2024-03-05 MED ORDER — LIDOCAINE HCL 1 % IJ SOLN
INTRAMUSCULAR | Status: AC
  Filled 2024-03-05: qty 20

## 2024-03-05 MED ORDER — SODIUM CHLORIDE 0.9 % IV SOLN: 250.0000 mL | INTRAVENOUS | Status: DC | PRN

## 2024-03-05 MED ORDER — SODIUM CHLORIDE 0.9% FLUSH: 3.0000 mL | INTRAVENOUS | Status: DC | PRN

## 2024-03-05 MED ORDER — FENTANYL CITRATE (PF) 100 MCG/2ML IJ SOLN
INTRAMUSCULAR | Status: DC | PRN
  Administered 2024-03-05: 25 ug via INTRAVENOUS

## 2024-03-05 MED ORDER — ASPIRIN 81 MG PO CHEW
81.0000 mg | CHEWABLE_TABLET | ORAL | Status: AC
  Administered 2024-03-05: 81 mg via ORAL

## 2024-03-05 MED ORDER — ONDANSETRON HCL 4 MG/2ML IJ SOLN: 4.0000 mg | Freq: Four times a day (QID) | INTRAMUSCULAR | Status: DC | PRN

## 2024-03-05 MED ORDER — ACETAMINOPHEN 325 MG PO TABS: 650.0000 mg | ORAL_TABLET | ORAL | Status: DC | PRN

## 2024-03-05 MED ORDER — VERAPAMIL HCL 2.5 MG/ML IV SOLN
INTRAVENOUS | Status: DC | PRN
  Administered 2024-03-05: 2.5 mg via INTRACORONARY

## 2024-03-05 MED ORDER — SODIUM CHLORIDE 0.9 % IV SOLN
250.0000 mL | INTRAVENOUS | Status: DC | PRN
  Administered 2024-03-05: 250 mL via INTRAVENOUS

## 2024-03-05 MED ORDER — HYDRALAZINE HCL 20 MG/ML IJ SOLN: 10.0000 mg | INTRAMUSCULAR | Status: DC | PRN

## 2024-03-05 SURGICAL SUPPLY — 8 items
CATH 5FR JL3.5 JR4 ANG PIG MP (CATHETERS) IMPLANT
DEVICE RAD TR BAND REGULAR (VASCULAR PRODUCTS) IMPLANT
DRAPE BRACHIAL (DRAPES) IMPLANT
GLIDESHEATH SLEND SS 6F .021 (SHEATH) IMPLANT
GUIDEWIRE INQWIRE 1.5J.035X260 (WIRE) IMPLANT
PACK CARDIAC CATH (CUSTOM PROCEDURE TRAY) ×1 IMPLANT
SET ATX-X65L (MISCELLANEOUS) IMPLANT
STATION PROTECTION PRESSURIZED (MISCELLANEOUS) IMPLANT

## 2024-03-05 NOTE — Discharge Instructions (Addendum)
 Resume eliquis tomorrow morning (Wednesday 03/06/2024).
